# Patient Record
Sex: Male | Born: 1960 | Race: White | Hispanic: No | Marital: Married | Smoking: Current every day smoker
Health system: Southern US, Community
[De-identification: ages and names within clinical notes are randomized; demographics above are authoritative.]

## PROBLEM LIST (undated history)

## (undated) DIAGNOSIS — K279 Peptic ulcer, site unspecified, unspecified as acute or chronic, without hemorrhage or perforation: Secondary | ICD-10-CM

## (undated) DIAGNOSIS — Q2381 Bicuspid aortic valve: Secondary | ICD-10-CM

## (undated) DIAGNOSIS — G4733 Obstructive sleep apnea (adult) (pediatric): Secondary | ICD-10-CM

## (undated) DIAGNOSIS — N529 Male erectile dysfunction, unspecified: Secondary | ICD-10-CM

## (undated) DIAGNOSIS — E785 Hyperlipidemia, unspecified: Secondary | ICD-10-CM

## (undated) DIAGNOSIS — I10 Essential (primary) hypertension: Secondary | ICD-10-CM

## (undated) DIAGNOSIS — I517 Cardiomegaly: Secondary | ICD-10-CM

## (undated) DIAGNOSIS — Q231 Congenital insufficiency of aortic valve: Secondary | ICD-10-CM

## (undated) DIAGNOSIS — I351 Nonrheumatic aortic (valve) insufficiency: Secondary | ICD-10-CM

## (undated) DIAGNOSIS — N200 Calculus of kidney: Secondary | ICD-10-CM

## (undated) DIAGNOSIS — I519 Heart disease, unspecified: Secondary | ICD-10-CM

## (undated) HISTORY — DX: Calculus of kidney: N20.0

## (undated) HISTORY — DX: Obstructive sleep apnea (adult) (pediatric): G47.33

## (undated) HISTORY — DX: Peptic ulcer, site unspecified, unspecified as acute or chronic, without hemorrhage or perforation: K27.9

## (undated) HISTORY — DX: Congenital insufficiency of aortic valve: Q23.1

## (undated) HISTORY — PX: LUMBAR DISC SURGERY: SHX700

## (undated) HISTORY — DX: Hyperlipidemia, unspecified: E78.5

## (undated) HISTORY — PX: LUMBAR LAMINECTOMY: SHX95

## (undated) HISTORY — DX: Cardiomegaly: I51.7

## (undated) HISTORY — DX: Nonrheumatic aortic (valve) insufficiency: I35.1

## (undated) HISTORY — DX: Essential (primary) hypertension: I10

## (undated) HISTORY — DX: Bicuspid aortic valve: Q23.81

## (undated) HISTORY — DX: Heart disease, unspecified: I51.9

## (undated) HISTORY — DX: Male erectile dysfunction, unspecified: N52.9

---

## 1999-02-25 ENCOUNTER — Ambulatory Visit (HOSPITAL_COMMUNITY): Admission: RE | Admit: 1999-02-25 | Discharge: 1999-02-25 | Payer: Self-pay | Admitting: Cardiology

## 1999-03-05 ENCOUNTER — Encounter: Payer: Self-pay | Admitting: Thoracic Surgery (Cardiothoracic Vascular Surgery)

## 1999-03-06 ENCOUNTER — Encounter: Payer: Self-pay | Admitting: Thoracic Surgery (Cardiothoracic Vascular Surgery)

## 1999-03-06 ENCOUNTER — Ambulatory Visit (HOSPITAL_COMMUNITY): Admission: RE | Admit: 1999-03-06 | Discharge: 1999-03-06 | Payer: Self-pay | Admitting: *Deleted

## 1999-03-12 ENCOUNTER — Encounter: Payer: Self-pay | Admitting: Thoracic Surgery (Cardiothoracic Vascular Surgery)

## 1999-03-12 ENCOUNTER — Inpatient Hospital Stay (HOSPITAL_COMMUNITY)
Admission: RE | Admit: 1999-03-12 | Discharge: 1999-03-31 | Payer: Self-pay | Admitting: Thoracic Surgery (Cardiothoracic Vascular Surgery)

## 1999-03-12 HISTORY — PX: AORTIC VALVE REPLACEMENT: SHX41

## 1999-03-13 ENCOUNTER — Encounter: Payer: Self-pay | Admitting: Thoracic Surgery (Cardiothoracic Vascular Surgery)

## 1999-03-14 ENCOUNTER — Encounter: Payer: Self-pay | Admitting: Cardiothoracic Surgery

## 1999-03-16 ENCOUNTER — Encounter: Payer: Self-pay | Admitting: Cardiothoracic Surgery

## 1999-03-16 ENCOUNTER — Encounter: Payer: Self-pay | Admitting: General Surgery

## 1999-03-18 ENCOUNTER — Encounter: Payer: Self-pay | Admitting: Thoracic Surgery (Cardiothoracic Vascular Surgery)

## 1999-03-19 ENCOUNTER — Encounter: Payer: Self-pay | Admitting: Thoracic Surgery (Cardiothoracic Vascular Surgery)

## 1999-03-20 ENCOUNTER — Encounter: Payer: Self-pay | Admitting: Urology

## 1999-03-24 ENCOUNTER — Encounter: Payer: Self-pay | Admitting: Thoracic Surgery (Cardiothoracic Vascular Surgery)

## 1999-04-11 ENCOUNTER — Encounter: Admission: RE | Admit: 1999-04-11 | Discharge: 1999-07-10 | Payer: Self-pay | Admitting: Endocrinology

## 1999-06-16 ENCOUNTER — Encounter: Admission: RE | Admit: 1999-06-16 | Discharge: 1999-06-16 | Payer: Self-pay | Admitting: Endocrinology

## 1999-07-28 ENCOUNTER — Encounter: Admission: RE | Admit: 1999-07-28 | Discharge: 1999-10-26 | Payer: Self-pay | Admitting: Endocrinology

## 1999-08-14 ENCOUNTER — Encounter: Admission: RE | Admit: 1999-08-14 | Discharge: 1999-08-14 | Payer: Self-pay | Admitting: Endocrinology

## 1999-08-14 ENCOUNTER — Encounter: Payer: Self-pay | Admitting: Endocrinology

## 2000-02-20 ENCOUNTER — Encounter: Admission: RE | Admit: 2000-02-20 | Discharge: 2000-02-20 | Payer: Self-pay | Admitting: Endocrinology

## 2000-02-20 ENCOUNTER — Encounter: Payer: Self-pay | Admitting: Endocrinology

## 2000-09-16 ENCOUNTER — Encounter: Payer: Self-pay | Admitting: Endocrinology

## 2000-09-16 ENCOUNTER — Encounter: Admission: RE | Admit: 2000-09-16 | Discharge: 2000-09-16 | Payer: Self-pay | Admitting: Endocrinology

## 2001-05-05 ENCOUNTER — Encounter: Admission: RE | Admit: 2001-05-05 | Discharge: 2001-05-05 | Payer: Self-pay | Admitting: Endocrinology

## 2001-05-05 ENCOUNTER — Encounter: Payer: Self-pay | Admitting: Endocrinology

## 2001-11-09 ENCOUNTER — Emergency Department (HOSPITAL_COMMUNITY): Admission: EM | Admit: 2001-11-09 | Discharge: 2001-11-09 | Payer: Self-pay | Admitting: Emergency Medicine

## 2001-11-09 ENCOUNTER — Encounter: Payer: Self-pay | Admitting: Emergency Medicine

## 2003-07-04 ENCOUNTER — Encounter: Admission: RE | Admit: 2003-07-04 | Discharge: 2003-07-04 | Payer: Self-pay | Admitting: Internal Medicine

## 2008-04-25 ENCOUNTER — Encounter: Payer: Self-pay | Admitting: Internal Medicine

## 2008-04-25 ENCOUNTER — Ambulatory Visit (HOSPITAL_BASED_OUTPATIENT_CLINIC_OR_DEPARTMENT_OTHER): Admission: RE | Admit: 2008-04-25 | Discharge: 2008-04-25 | Payer: Self-pay | Admitting: Cardiology

## 2008-05-05 ENCOUNTER — Ambulatory Visit: Payer: Self-pay | Admitting: Internal Medicine

## 2008-07-19 ENCOUNTER — Ambulatory Visit: Payer: Self-pay | Admitting: Internal Medicine

## 2008-07-19 DIAGNOSIS — I10 Essential (primary) hypertension: Secondary | ICD-10-CM | POA: Insufficient documentation

## 2008-07-19 DIAGNOSIS — E785 Hyperlipidemia, unspecified: Secondary | ICD-10-CM

## 2008-07-19 DIAGNOSIS — K219 Gastro-esophageal reflux disease without esophagitis: Secondary | ICD-10-CM | POA: Insufficient documentation

## 2008-07-19 DIAGNOSIS — J309 Allergic rhinitis, unspecified: Secondary | ICD-10-CM | POA: Insufficient documentation

## 2008-07-19 DIAGNOSIS — G4730 Sleep apnea, unspecified: Secondary | ICD-10-CM | POA: Insufficient documentation

## 2008-07-31 DIAGNOSIS — E119 Type 2 diabetes mellitus without complications: Secondary | ICD-10-CM

## 2010-05-08 DEATH — deceased

## 2010-06-26 ENCOUNTER — Ambulatory Visit: Payer: Self-pay | Admitting: Cardiology

## 2010-07-21 ENCOUNTER — Ambulatory Visit: Payer: Self-pay | Admitting: Cardiology

## 2010-07-21 ENCOUNTER — Encounter: Payer: Self-pay | Admitting: Cardiology

## 2010-07-21 ENCOUNTER — Ambulatory Visit (HOSPITAL_COMMUNITY)
Admission: RE | Admit: 2010-07-21 | Discharge: 2010-07-21 | Payer: Self-pay | Source: Home / Self Care | Admitting: Cardiology

## 2010-07-21 ENCOUNTER — Ambulatory Visit: Payer: Self-pay

## 2010-08-20 ENCOUNTER — Ambulatory Visit: Payer: Self-pay | Admitting: Cardiology

## 2010-08-26 ENCOUNTER — Encounter: Payer: Self-pay | Admitting: Cardiology

## 2010-08-26 ENCOUNTER — Encounter (HOSPITAL_COMMUNITY): Admission: RE | Admit: 2010-08-26 | Payer: Self-pay | Source: Home / Self Care | Admitting: Cardiology

## 2010-08-26 ENCOUNTER — Ambulatory Visit: Payer: Self-pay

## 2010-09-02 ENCOUNTER — Telehealth (INDEPENDENT_AMBULATORY_CARE_PROVIDER_SITE_OTHER): Payer: Self-pay | Admitting: *Deleted

## 2010-09-03 ENCOUNTER — Encounter: Payer: Self-pay | Admitting: Cardiology

## 2010-09-03 ENCOUNTER — Encounter: Payer: Self-pay | Admitting: Internal Medicine

## 2010-09-03 ENCOUNTER — Encounter (HOSPITAL_COMMUNITY)
Admission: RE | Admit: 2010-09-03 | Discharge: 2010-10-07 | Payer: Self-pay | Source: Home / Self Care | Attending: Cardiology | Admitting: Cardiology

## 2010-10-09 NOTE — Assessment & Plan Note (Addendum)
Summary: Cardiology Nuclear Testing  Nuclear Med Background Indications for Stress Test: Evaluation for Ischemia, Post Hospital  Indications Comments: [facility] 2 weeks ago with syncope   History: Echo, Heart Catheterization, Myocardial Perfusion Study  History Comments: '98 MPS:OK per patient; '01 AVR; 07/21/10  Echo:EF=50%  Symptoms: Dizziness, DOE, Fatigue, Syncope  Symptoms Comments: Syncopal episode 2 weeks ago while coughing, after recovery from pneumonia.   Nuclear Pre-Procedure Cardiac Risk Factors: History of Smoking, Hypertension, IDDM Type 2, Lipids, Obesity Caffeine/Decaff Intake: none NPO After: 9:00 PM Lungs: Diminished, but no wheezes.  O2  Sat 97% on RA. IV 0.9% NS with Angio Cath: 20g     IV Site: R Hand IV Started by: Cathlyn Parsons, RN Chest Size (in) 42     Height (in): 69 Weight (lb): 204 BMI: 30.23 Tech Comments: Metoprolol held x 13 hours. Patient held insulin this am. BS 356 at 0940.  Nuclear Med Study 1 or 2 day study:  1 day     Stress Test Type:  Treadmill/Lexiscan Reading MD:  Olga Millers, MD     Referring MD:  Roger Shelter, MD Resting Radionuclide:  Technetium 65m Tetrofosmin     Resting Radionuclide Dose:  11 mCi  Stress Radionuclide:  Technetium 85m Tetrofosmin     Stress Radionuclide Dose:  33 mCi   Stress Protocol Exercise Time (min):  2:00 min     Max HR:  100 bpm     Predicted Max HR:  172 bpm  Max Systolic BP: 151 mm Hg     Percent Max HR:  58.14 %Rate Pressure Product:  98119  Lexiscan: 0.4 mg   Stress Test Technologist:  Rea College, CMA-N     Nuclear Technologist:  Domenic Polite, CNMT  Rest Procedure  Myocardial perfusion imaging was performed at rest 45 minutes following the intravenous administration of Technetium 82m Tetrofosmin.  Stress Procedure  The patient received IV Lexiscan 0.4 mg over 15-seconds with concurrent low level exercise and then Technetium 5m Tetrofosmin was injected at 30-seconds.  There were no  significant changes with Lexiscan.  Quantitative spect images were obtained after a 45 minute delay.  QPS Raw Data Images:  Normal; no motion artifact; normal heart/lung ratio. Stress Images:  Thinning of inferior wall Rest Images:  Thinning of inferior wall Subtraction (SDS):  No ischemia Transient Ischemic Dilatation:  .90  (Normal <1.22)  Lung/Heart Ratio:  .31  (Normal <0.45)  Quantitative Gated Spect Images QGS EDV:  110 ml QGS ESV:  54 ml QGS EF:  51 % QGS cine images:  Septal akinesis (due to previous thoracotomy) otherwise normal  Findings Normal nuclear study      Overall Impression  Exercise Capacity: Lexiscan with no exercise. ECG Impression: No significant ST segment change suggestive of ischemia. Overall Impression: Normal stress nuclear study.

## 2010-10-09 NOTE — Progress Notes (Signed)
Summary: Nuclear Pre-Procedure  Phone Note Outgoing Call Call back at [facility] Phone [phone]   Call placed by: Stanton Kidney, EMT-P,  September 02, 2010 4:45 PM Call placed to: Patient Action Taken: Phone Call Completed Summary of Call: Reviewed information on Myoview Information Sheet (see scanned document for further details).  Spoke with the patient. Stanton Kidney, EMT-P  September 02, 2010 4:45 PM     Nuclear Med Background Indications for Stress Test: Evaluation for Ischemia   History: Echo, Myocardial Perfusion Study  History Comments: '98 MPS 11/11 Echo:EF=50%  Symptoms: SOB    Nuclear Pre-Procedure Cardiac Risk Factors: History of Smoking, Hypertension, IDDM Type 2, Lipids, Obesity

## 2010-12-03 ENCOUNTER — Telehealth: Payer: Self-pay | Admitting: *Deleted

## 2010-12-03 NOTE — Telephone Encounter (Signed)
[facility] DR Deborah Chalk HAD SPOKEN TO HIM IN THE PAST ABOUT REFERRING HIM TO ANOTHER DR.  REQUESTING A CALL BACK

## 2010-12-04 ENCOUNTER — Telehealth: Payer: Self-pay | Admitting: *Deleted

## 2010-12-04 NOTE — Telephone Encounter (Signed)
Pt called, unable to have an erection with high doses of Viagra and Cialis.  Norma Fredrickson NP had recommended pt go to urologist if pt failed Viagra and Cialis.  RN set pt up to see Dr. Vonita Moss at [facility] on April 9th at 1115 am.  Pt notified.

## 2010-12-11 ENCOUNTER — Telehealth: Payer: Self-pay | Admitting: Cardiology

## 2010-12-11 NOTE — Telephone Encounter (Signed)
[facility] WAITING FOR FAX FROM KELLY. PLACED CHART IN KELLY'S BOX.

## 2010-12-23 ENCOUNTER — Telehealth: Payer: Self-pay | Admitting: Cardiology

## 2010-12-23 NOTE — Telephone Encounter (Signed)
Wants to speak with Tresa Endo regarding his recent visit to the urologist.

## 2010-12-23 NOTE — Telephone Encounter (Signed)
Wants to speak w/Kelly regarding his visit with the urologist that we referred him to.

## 2010-12-23 NOTE — Telephone Encounter (Signed)
Pt recently saw Dr. Vonita Moss and was given four options for his erectile dysfunction.  Pt has decided to go with the injections.  Pt has been unable to reach Dr. Enos Fling office regarding starting the injections and would like to know what to do.  RN called Dr. Enos Fling nurse and she will call pt later to day about starting injections.  Pt aware.

## 2010-12-27 ENCOUNTER — Emergency Department (HOSPITAL_COMMUNITY)
Admission: EM | Admit: 2010-12-27 | Discharge: 2010-12-27 | Disposition: A | Discharge status: Home / Self Care | Payer: Managed Care, Other (non HMO) | Attending: Emergency Medicine | Admitting: Emergency Medicine

## 2010-12-27 DIAGNOSIS — F329 Major depressive disorder, single episode, unspecified: Secondary | ICD-10-CM | POA: Insufficient documentation

## 2010-12-27 DIAGNOSIS — F3289 Other specified depressive episodes: Secondary | ICD-10-CM | POA: Insufficient documentation

## 2010-12-27 DIAGNOSIS — R10816 Epigastric abdominal tenderness: Secondary | ICD-10-CM | POA: Insufficient documentation

## 2010-12-27 DIAGNOSIS — T45515A Adverse effect of anticoagulants, initial encounter: Secondary | ICD-10-CM | POA: Insufficient documentation

## 2010-12-27 DIAGNOSIS — K2990 Gastroduodenitis, unspecified, without bleeding: Secondary | ICD-10-CM | POA: Insufficient documentation

## 2010-12-27 DIAGNOSIS — F411 Generalized anxiety disorder: Secondary | ICD-10-CM | POA: Insufficient documentation

## 2010-12-27 DIAGNOSIS — R791 Abnormal coagulation profile: Secondary | ICD-10-CM | POA: Insufficient documentation

## 2010-12-27 DIAGNOSIS — K2970 Gastritis, unspecified, without bleeding: Secondary | ICD-10-CM | POA: Insufficient documentation

## 2010-12-27 DIAGNOSIS — Z794 Long term (current) use of insulin: Secondary | ICD-10-CM | POA: Insufficient documentation

## 2010-12-27 DIAGNOSIS — I10 Essential (primary) hypertension: Secondary | ICD-10-CM | POA: Insufficient documentation

## 2010-12-27 DIAGNOSIS — E876 Hypokalemia: Secondary | ICD-10-CM | POA: Insufficient documentation

## 2010-12-27 DIAGNOSIS — E119 Type 2 diabetes mellitus without complications: Secondary | ICD-10-CM | POA: Insufficient documentation

## 2010-12-27 LAB — COMPREHENSIVE METABOLIC PANEL
ALT: 15 U/L (ref 0–53)
AST: 18 U/L (ref 0–37)
Albumin: 3.7 g/dL (ref 3.5–5.2)
Alkaline Phosphatase: 135 U/L — ABNORMAL HIGH (ref 39–117)
Calcium: 10 mg/dL (ref 8.4–10.5)
GFR calc Af Amer: >60 mL/min (ref >60)
Potassium: 2.9 mEq/L — ABNORMAL LOW (ref 3.5–5.1)
Sodium: 137 mEq/L (ref 135–145)
Total Protein: 7.2 g/dL (ref 6.0–8.3)

## 2010-12-27 LAB — ETHANOL: Alcohol, Ethyl (B): <5 mg/dL (ref 0–10)

## 2010-12-27 LAB — CBC
HCT: 40 % (ref 39.0–52.0)
MCH: 27.6 pg (ref 26.0–34.0)
MCHC: 34 g/dL (ref 30.0–36.0)
Platelets: 282 10*3/uL (ref 150–400)
RBC: 4.93 MIL/uL (ref 4.22–5.81)
WBC: 7.7 10*3/uL (ref 4.0–10.5)

## 2010-12-27 LAB — RAPID URINE DRUG SCREEN, HOSP PERFORMED
Amphetamines: NOT DETECTED
Benzodiazepines: NOT DETECTED
Tetrahydrocannabinol: NOT DETECTED

## 2010-12-27 LAB — DIFFERENTIAL
Basophils Absolute: 0.1 10*3/uL (ref 0.0–0.1)
Basophils Relative: 1 % (ref 0–1)
Eosinophils Absolute: 0.1 10*3/uL (ref 0.0–0.7)
Lymphs Abs: 1.6 10*3/uL (ref 0.7–4.0)
Monocytes Relative: 11 % (ref 3–12)
Neutro Abs: 5.2 10*3/uL (ref 1.7–7.7)
Neutrophils Relative %: 68 % (ref 43–77)

## 2010-12-31 ENCOUNTER — Telehealth: Payer: Self-pay | Admitting: Internal Medicine

## 2011-01-01 NOTE — Telephone Encounter (Signed)
Pt rescheduled to see Mike Gip PA on a day when Dr. Marina Goodell is the supervising physician. Appt given for pt to see Amy 01/06/11@10am . Scheduled with Malachi Bonds at [facility]. Malachi Bonds to fax records, pt is a new pt to the practice.

## 2011-01-06 ENCOUNTER — Ambulatory Visit: Payer: Managed Care, Other (non HMO) | Admitting: Physician Assistant

## 2011-01-08 ENCOUNTER — Other Ambulatory Visit: Payer: Self-pay | Admitting: Endocrinology

## 2011-01-08 DIAGNOSIS — R935 Abnormal findings on diagnostic imaging of other abdominal regions, including retroperitoneum: Secondary | ICD-10-CM

## 2011-01-11 ENCOUNTER — Other Ambulatory Visit: Payer: Managed Care, Other (non HMO)

## 2011-01-12 ENCOUNTER — Ambulatory Visit
Admission: RE | Admit: 2011-01-12 | Discharge: 2011-01-12 | Disposition: A | Discharge status: Home / Self Care | Payer: Managed Care, Other (non HMO) | Source: Ambulatory Visit | Attending: Endocrinology | Admitting: Endocrinology

## 2011-01-12 ENCOUNTER — Other Ambulatory Visit: Payer: Managed Care, Other (non HMO)

## 2011-01-12 DIAGNOSIS — R935 Abnormal findings on diagnostic imaging of other abdominal regions, including retroperitoneum: Secondary | ICD-10-CM

## 2011-01-20 NOTE — Procedures (Signed)
[name], Glenn Orr              ACCOUNT NO.:  [id number]   MEDICAL RECORD NO.:  [id number]          PATIENT TYPE:  OUT   LOCATION:  SLEEP CENTER                 FACILITY:  [facility]   PHYSICIAN:  Clinton D. Maple Hudson, MD, FCCP, FACPDATE OF BIRTH:  04/26/62   DATE OF STUDY:  04/25/2008                            NOCTURNAL POLYSOMNOGRAM   REFERRING PHYSICIAN:  Colleen Can. Deborah Chalk, M.D.   INDICATION FOR STUDY:  Hypersomnia with sleep apnea.   EPWORTH SLEEPINESS SCORE:  10/24.  BMI 31.3, weight 212 pounds, height  69 inches, neck 18 inches.   MEDICATIONS:  Home medications are charted and reviewed.   SLEEP ARCHITECTURE:  Split study protocol.  During the diagnostic phase,  total sleep time 100 minutes with sleep efficiency 47.3%.  Stage 1 was  35%, stage 2 57.5%, stage 3 7.5%, REM absent.  Sleep latency 74.5  minutes.  Awake after sleep onset 25.5 minutes.  Arousal index 36.6,  indicating increased EEG arousal.  Bed time medication charted and  reviewed.   RESPIRATORY DATA:  Split study protocol.  Apnea/hypopnea index (AHI) 12  per hour.  Twenty events were scored, including five obstructive apneas  and 15 hypopneas.  Most events were associated with supine sleep  position.  REM AHI n/a.  CPAP was titrated to 14 CWP, AHI 2.4 per hour.  He chose a medium Quattro mask with heated humidifier.   OXYGEN DATA:  Mild snoring with oxygen desaturation to a nadir of 78%.  After CPAP control, mean oxygen saturation through the study 95.1% on  room air.   CARDIAC DATA:  Normal sinus rhythm.   MOVEMENT-PARASOMNIA:  A total of 52 limb jerks were counted of which 11  were associated with arousal or awakening for a periodic limb movement  with arousal index of 6.6 per hour.  No bathroom trips.  Patient was  described as extremely restless.   IMPRESSIONS-RECOMMENDATIONS:  1. Patient was restless through the study and sleep was marked by      frequent brief awakenings with sustained wakefulness  between      midnight and 3 a.m.  It was noted that he gave history of panic      attacks and requested that his wife be allowed to stay with him,      sleeping in a recliner.  Bedtime medications included omeprazole,      lovastatin, lisinopril, warfarin, Cymbalta, Humalog and      propoxyphene.  2. Mild obstructive sleep apnea/hypopnea syndrome, AHI 12 per hour,      all hypopneas associated with supine sleep position.  Mild snoring      with oxygen desaturation to a nadir of 78%.  3. CPAP titrated to 14 CWP, AHI 2.4 per hour.  He chose a medium      Mirage Quattro full face mask with heated humidifier.      Clinton D. Maple Hudson, MD, FCCP, FACP  Diplomate, Biomedical engineer of Sleep Medicine  Electronically Signed     CDY/MEDQ  D:  05/05/2008 11:18:52  T:  05/05/2008 11:43:51  Job:  [id number]

## 2011-01-28 ENCOUNTER — Ambulatory Visit: Payer: Managed Care, Other (non HMO) | Admitting: Gastroenterology

## 2011-02-06 ENCOUNTER — Ambulatory Visit: Payer: Managed Care, Other (non HMO) | Admitting: Internal Medicine

## 2012-01-31 ENCOUNTER — Other Ambulatory Visit: Payer: Self-pay | Admitting: *Deleted

## 2012-01-31 ENCOUNTER — Encounter: Payer: Self-pay | Admitting: *Deleted

## 2012-07-21 ENCOUNTER — Encounter: Payer: Self-pay | Admitting: Cardiology

## 2013-12-05 ENCOUNTER — Ambulatory Visit (INDEPENDENT_AMBULATORY_CARE_PROVIDER_SITE_OTHER): Payer: Medicare Other | Admitting: Cardiovascular Disease

## 2013-12-05 ENCOUNTER — Encounter: Payer: Self-pay | Admitting: Cardiovascular Disease

## 2013-12-05 VITALS — BP 138/86 | HR 76 | Ht 69.0 in | Wt 213.0 lb

## 2013-12-05 DIAGNOSIS — Z954 Presence of other heart-valve replacement: Secondary | ICD-10-CM

## 2013-12-05 DIAGNOSIS — I10 Essential (primary) hypertension: Secondary | ICD-10-CM

## 2013-12-05 DIAGNOSIS — Z952 Presence of prosthetic heart valve: Secondary | ICD-10-CM

## 2013-12-05 DIAGNOSIS — I739 Peripheral vascular disease, unspecified: Secondary | ICD-10-CM

## 2013-12-05 DIAGNOSIS — E785 Hyperlipidemia, unspecified: Secondary | ICD-10-CM

## 2013-12-05 NOTE — Assessment & Plan Note (Signed)
History of bicuspid aortic valve with aortic insufficiency. He had mechanical aVR performed by Dr. Roxy Manns  approximately the year 2000. His last 2-D echo was done 2- 3 years ago. He is on Coumadin anticoagulation and is aware of SBE prophylaxis.

## 2013-12-05 NOTE — Progress Notes (Signed)
12/05/2013 Glenn Orr   1960/12/01  [id number]  Primary Physician Sheela Stack, MD Primary Cardiologist: Lorretta Harp MD Renae Gloss   HPI:  Glenn Orr is a pleasant 53 year old moderately overweight but was Caucasian male father of one 13 year old daughter referred by Dr. Reynold Bowen for cardiovascular evaluation because of claudication. He is retired from working for the state. He has a history of St. Jude aVR performed by Dr. Roxy Manns back in 2000 for bicuspid aortic valve and aortic insufficiency.his cardiac risk factor profile is remarkable for tobacco abuse currently smoking one pack per day. He has diabetes, hypertension and hyperlipidemia. He has never had a heart attack or stroke and denies chest pain. He does get dyspneic on exertion. There is a question of diabetic peripheral neuropathy with tingling in his feet and numbness also gives symptoms compatible with claudication.   Current Outpatient Prescriptions  Medication Sig Dispense Refill  . atorvastatin (LIPITOR) 40 MG tablet Take 40 mg by mouth daily.      . DULoxetine (CYMBALTA) 60 MG capsule Take 60 mg by mouth daily.      . fluticasone (FLONASE) 50 MCG/ACT nasal spray Place 1 spray into both nostrils daily as needed for allergies or rhinitis.      Marland Kitchen gemfibrozil (LOPID) 600 MG tablet Take 600 mg by mouth daily.      . insulin lispro protamine-insulin lispro (HUMALOG 50/50) (50-50) 100 UNIT/ML SUSP Inject 42 Units into the skin at bedtime.       . insulin lispro protamine-insulin lispro (HUMALOG 75/25) (75-25) 100 UNIT/ML SUSP Inject 60 Units into the skin every morning.       Marland Kitchen lisinopril (PRINIVIL,ZESTRIL) 10 MG tablet Take 10 mg by mouth daily.      . metoprolol (LOPRESSOR) 50 MG tablet Take 50 mg by mouth at bedtime.       Marland Kitchen omeprazole (PRILOSEC) 20 MG capsule Take 20 mg by mouth daily.      Marland Kitchen warfarin (COUMADIN) 5 MG tablet Take 5 mg by mouth as directed. Followed by Dr Forde Dandy      . furosemide  (LASIX) 40 MG tablet Take 40 mg by mouth daily.      Marland Kitchen lovastatin (MEVACOR) 40 MG tablet Take 40 mg by mouth 2 (two) times daily.       No current facility-administered medications for this visit.    No Known Allergies  History   Social History  . Marital Status: Married    Spouse Name: N/A    Number of Children: N/A  . Years of Education: N/A   Occupational History  . Not on file.   Social History Main Topics  . Smoking status: Current Every Day Smoker -- 0.75 packs/day    Types: Cigarettes    Last Attempt to Quit: 09/07/1998  . Smokeless tobacco: Never Used  . Alcohol Use: Not on file  . Drug Use: Not on file  . Sexual Activity: Not on file   Other Topics Concern  . Not on file   Social History Narrative  . No narrative on file     Review of Systems: General: negative for chills, fever, night sweats or weight changes.  Cardiovascular: negative for chest pain, dyspnea on exertion, edema, orthopnea, palpitations, paroxysmal nocturnal dyspnea or shortness of breath Dermatological: negative for rash Respiratory: negative for cough or wheezing Urologic: negative for hematuria Abdominal: negative for nausea, vomiting, diarrhea, bright red blood per rectum, melena, or hematemesis Neurologic: negative for visual changes, syncope,  or dizziness All other systems reviewed and are otherwise negative except as noted above.    Blood pressure 138/86, pulse 76, height 5\' 9"  (1.753 m), weight 213 lb (96.616 kg).  General appearance: alert and no distress Neck: no adenopathy, no carotid bruit, no JVD, supple, symmetrical, trachea midline and thyroid not enlarged, symmetric, no tenderness/mass/nodules Lungs: clear to auscultation bilaterally Heart: Chrisp prosthetic valve sounds Extremities: extremities normal, atraumatic, no cyanosis or edema and 2+ femoral and pedal pulses  EKG normal sinus rhythm at 76 with nonspecific ST and T wave changes  ASSESSMENT AND PLAN:    Claudication The patient has good pedal pulses. He does have symptoms compatible with diabetic peripheral neuropathy. He does complain of some symptoms that may be claudication in nature. Will check lower extremity arterial Doppler studies.  HYPERTENSION Controlled on current medications  HYPERLIPIDEMIA On statin therapy followed by his PCP  H/O aortic valve replacement History of bicuspid aortic valve with aortic insufficiency. He had mechanical aVR performed by Dr. Roxy Manns  approximately the year 2000. His last 2-D echo was done 2- 3 years ago. He is on Coumadin anticoagulation and is aware of SBE prophylaxis.      Lorretta Harp MD FACP,FACC,FAHA, [facility] 12/05/2013 5:02 PM

## 2013-12-05 NOTE — Assessment & Plan Note (Signed)
Controlled on current medications 

## 2013-12-05 NOTE — Patient Instructions (Signed)
  We will see you back in follow up in 1 year with Dr Gwenlyn Found  Dr Gwenlyn Found has ordered : 1.  Echocardiogram. Echocardiography is a painless test that uses sound waves to create images of your heart. It provides your doctor with information about the size and shape of your heart and how well your heart's chambers and valves are working. This procedure takes approximately one hour. There are no restrictions for this procedure.   2. lower extremity arterial doppler- During this test, ultrasound is used to evaluate arterial blood flow in the legs. Allow approximately one hour for this exam.

## 2013-12-05 NOTE — Assessment & Plan Note (Signed)
The patient has good pedal pulses. He does have symptoms compatible with diabetic peripheral neuropathy. He does complain of some symptoms that may be claudication in nature. Will check lower extremity arterial Doppler studies.

## 2013-12-05 NOTE — Assessment & Plan Note (Signed)
On statin therapy followed by his PCP 

## 2013-12-21 ENCOUNTER — Telehealth (HOSPITAL_COMMUNITY): Payer: Self-pay | Admitting: *Deleted

## 2013-12-26 ENCOUNTER — Ambulatory Visit (HOSPITAL_COMMUNITY)
Admission: RE | Admit: 2013-12-26 | Discharge: 2013-12-26 | Disposition: A | Discharge status: Home / Self Care | Payer: Medicare Other | Source: Ambulatory Visit | Attending: Cardiology | Admitting: Cardiology

## 2013-12-26 ENCOUNTER — Ambulatory Visit (HOSPITAL_BASED_OUTPATIENT_CLINIC_OR_DEPARTMENT_OTHER)
Admission: RE | Admit: 2013-12-26 | Discharge: 2013-12-26 | Disposition: A | Discharge status: Home / Self Care | Payer: Medicare Other | Source: Ambulatory Visit | Attending: Cardiology | Admitting: Cardiology

## 2013-12-26 DIAGNOSIS — Z952 Presence of prosthetic heart valve: Secondary | ICD-10-CM

## 2013-12-26 DIAGNOSIS — I739 Peripheral vascular disease, unspecified: Secondary | ICD-10-CM

## 2013-12-26 DIAGNOSIS — Z954 Presence of other heart-valve replacement: Secondary | ICD-10-CM | POA: Insufficient documentation

## 2013-12-26 DIAGNOSIS — I517 Cardiomegaly: Secondary | ICD-10-CM

## 2013-12-26 NOTE — Progress Notes (Signed)
2D Echo Performed 12/26/2013    Marygrace Drought, RCS

## 2013-12-26 NOTE — Progress Notes (Signed)
Lower Extremity Arterial Duplex Completed. °Brianna L Mazza,RVT °

## 2014-01-01 ENCOUNTER — Encounter: Payer: Self-pay | Admitting: *Deleted

## 2014-03-15 ENCOUNTER — Encounter: Payer: Self-pay | Admitting: Internal Medicine

## 2014-05-23 ENCOUNTER — Telehealth: Payer: Self-pay

## 2014-05-23 ENCOUNTER — Ambulatory Visit (INDEPENDENT_AMBULATORY_CARE_PROVIDER_SITE_OTHER): Payer: Medicare Other | Admitting: Internal Medicine

## 2014-05-23 ENCOUNTER — Encounter: Payer: Self-pay | Admitting: Internal Medicine

## 2014-05-23 VITALS — BP 144/82 | HR 72 | Ht 67.0 in | Wt 215.2 lb

## 2014-05-23 DIAGNOSIS — E119 Type 2 diabetes mellitus without complications: Secondary | ICD-10-CM

## 2014-05-23 DIAGNOSIS — Z1211 Encounter for screening for malignant neoplasm of colon: Secondary | ICD-10-CM

## 2014-05-23 DIAGNOSIS — Z7901 Long term (current) use of anticoagulants: Secondary | ICD-10-CM

## 2014-05-23 DIAGNOSIS — Z794 Long term (current) use of insulin: Secondary | ICD-10-CM

## 2014-05-23 DIAGNOSIS — K219 Gastro-esophageal reflux disease without esophagitis: Secondary | ICD-10-CM

## 2014-05-23 MED ORDER — MOVIPREP 100 G PO SOLR: 1.0000 | Freq: Once | ORAL | Status: DC

## 2014-05-23 NOTE — Patient Instructions (Signed)
You have been scheduled for a colonoscopy. Please follow written instructions given to you at your visit today.  Please pick up your prep kit at the pharmacy within the next 1-3 days. If you use inhalers (even only as needed), please bring them with you on the day of your procedure. Your physician has requested that you go to www.startemmi.com and enter the access code given to you at your visit today. This web site gives a general overview about your procedure. However, you should still follow specific instructions given to you by our office regarding your preparation for the procedure.   I will contact Dr. Gwenlyn Found regarding how to handle your Coumadin before the procedure and let you know.

## 2014-05-23 NOTE — Telephone Encounter (Signed)
Mechanical AVR. OK to come off coumadin for colonoscopy if absolutely necessary  JJB

## 2014-05-23 NOTE — Telephone Encounter (Signed)
  05/23/2014   RE: Glenn Orr DOB: [birth year] MRN: [id number]   Dear Gwenlyn Found,    We have scheduled the above patient for an endoscopic procedure. Our records show that he is on anticoagulation therapy.   Please advise as to how long the patient may come off his therapy of Coumadin prior to the procedure, or if he will need a Lovenox bridge, which is scheduled for 06/08/2014.  Please fax back/ or route the completed form to [place] at [phone].   Sincerely,    Phillis Haggis

## 2014-05-23 NOTE — Progress Notes (Signed)
HISTORY OF PRESENT ILLNESS:  Glenn Orr is a 53 y.o. male with multiple significant medical problems including valvular heart disease status post aortic valve replacement (mechanical valve St. Jude) on chronic Coumadin, insulin requiring diabetes mellitus, hypertension, hyperlipidemia, obesity, sleep apnea, and chronic tobacco use. He is sent today regarding screening colonoscopy. No prior history of the same. GI review of systems is remarkable for heartburn without dysphagia. On demand PPI helps. No other GI complaints. His primary care physician is Dr. Forde Dandy and his cardiologist is Dr. Gwenlyn Found.   REVIEW OF SYSTEMS:  All non-GI ROS negative except for shortness of breath, insomnia, arthritis, cough  Past Medical History  Diagnosis Date  . Diabetes mellitus   . Bicuspid aortic valve   . Aortic insufficiency   . Left ventricular dysfunction     EF ofn55-60%  . Left ventricular hypertrophy   . Erectile dysfunction   . Duodenal ulcer   . Hypertension   . Hyperlipidemia   . Kidney stones   . Peptic ulcer disease   . OSA (obstructive sleep apnea)     Past Surgical History  Procedure Laterality Date  . Aortic valve replacement  03/12/1999    #27 St. Jude mechanical valve  . Lumbar laminectomy    . Lumbar disc surgery      Social History Glenn Orr  reports that he has been smoking Cigarettes.  He has been smoking about 0.75 packs per day. He has never used smokeless tobacco. He reports that he does not drink alcohol or use illicit drugs.  family history is negative for Colon cancer, Colon polyps, Diabetes, and Kidney disease.  No Known Allergies     PHYSICAL EXAMINATION: Vital signs: BP 144/82  Pulse 72  Ht 5\' 7"  (1.702 m)  Wt 215 lb 4 oz (97.637 kg)  BMI 33.71 kg/m2  Constitutional: generally well-appearing, no acute distress Psychiatric: alert and oriented x3, cooperative Eyes: extraocular movements intact, anicteric, conjunctiva pink Mouth: oral pharynx moist,  no lesions Neck: supple no lymphadenopathy Cardiovascular: Mechanical heart sounds with heart regular rate and rhythm, no murmur Lungs: clear to auscultation bilaterally Abdomen: soft, obese, nontender, nondistended, no obvious ascites, no peritoneal signs, normal bowel sounds, no organomegaly Rectal: Deferred until colonoscopy Extremities: no lower extremity edema bilaterally Skin: no lesions on visible extremities Neuro: No focal deficits.   ASSESSMENT:  #1. Screening colonoscopy. Appropriate candidate without contraindication. High risk due to his comorbidities and the need to manage chronic medications as described #2. Insulin requiring diabetes #3. Status post aortic valve replacement on chronic Coumadin #4. Chronic GERD #5. Multiple medical problems   PLAN:  #1. Screening colonoscopy.The nature of the procedure, as well as the risks, benefits, and alternatives were carefully and thoroughly reviewed with the patient. Ample time for discussion and questions allowed. The patient understood, was satisfied, and agreed to proceed. #2. Consult with his cardiologist regarding management of chronic anticoagulation therapy. He may need Lovenox bridge #3. Adjust insulin. Recommend take 30 units of his evening insulin and hold a.m. insulin. We will schedule procedure for morning slot #4. Reflux precautions #5. Advised to take omeprazole daily for chronic GERD symptoms. As well protect upper GI mucosa in a patient on chronic Coumadin

## 2014-05-25 NOTE — Telephone Encounter (Signed)
Please see below  Thank you, Brita Romp RN [place], [place] [phone]

## 2014-05-30 NOTE — Telephone Encounter (Signed)
Told patient that per Dr. Gwenlyn Found, he could hold his coumadin for 5 days prior to his procedure.  Also told him I would leave a free movi prep voucher up front for him.  Patient acknowledged and understood

## 2014-06-01 ENCOUNTER — Telehealth: Payer: Self-pay | Admitting: Internal Medicine

## 2014-06-01 NOTE — Telephone Encounter (Signed)
Patient came by to let me know what he has been instructed to do prior to his colonoscopy.  It appeared to be a Lovenox bridge but could you please clarify these instructions so I can have them for his chart?  Thank you.

## 2014-06-05 NOTE — Telephone Encounter (Signed)
No Lovenox bridging required for colonoscopy with holding Coumadin in the setting of aVR

## 2014-06-06 NOTE — Telephone Encounter (Signed)
Called patient to let him know that the injections he described were not a Lovenox bridge, per Dr. Gwenlyn Found and he should just hold his Coumadin as directed.  Patient acknowledged and understood

## 2014-06-08 ENCOUNTER — Ambulatory Visit (AMBULATORY_SURGERY_CENTER): Payer: Medicare Other | Admitting: Internal Medicine

## 2014-06-08 ENCOUNTER — Encounter: Payer: Self-pay | Admitting: Internal Medicine

## 2014-06-08 VITALS — BP 146/79 | HR 81 | Temp 97.4°F | Resp 20 | Ht 67.0 in | Wt 215.0 lb

## 2014-06-08 DIAGNOSIS — Z1211 Encounter for screening for malignant neoplasm of colon: Secondary | ICD-10-CM

## 2014-06-08 DIAGNOSIS — D125 Benign neoplasm of sigmoid colon: Secondary | ICD-10-CM

## 2014-06-08 LAB — GLUCOSE, CAPILLARY
GLUCOSE-CAPILLARY: 258 mg/dL — AB (ref 70–99)
Glucose-Capillary: 259 mg/dL — ABNORMAL HIGH (ref 70–99)

## 2014-06-08 MED ORDER — SODIUM CHLORIDE 0.9 % IV SOLN: 500.0000 mL | INTRAVENOUS | Status: DC

## 2014-06-08 NOTE — Op Note (Signed)
[place]  [address] [place], [id number]   COLONOSCOPY PROCEDURE REPORT  PATIENT: Glenn, Orr  MR#: [id number] BIRTHDATE: Oct 22, 1960 , 16  yrs. old GENDER: male ENDOSCOPIST: Eustace Quail, MD REFERRED JF:[id number] Forde Dandy, M.D. PROCEDURE DATE:  06/08/2014 PROCEDURE:   Colonoscopy with snare polypectomy x 1 First Screening Colonoscopy - Avg.  risk and is 50 yrs.  old or older Yes.  Prior Negative Screening - Now for repeat screening. N/A  History of Adenoma - Now for follow-up colonoscopy & has been > or = to 3 yrs.  N/A  Polyps Removed Today? Yes. ASA CLASS:   Class III INDICATIONS:average risk for colorectal cancer. MEDICATIONS: Monitored anesthesia care and Propofol 280 mg IV  DESCRIPTION OF PROCEDURE:   After the risks benefits and alternatives of the procedure were thoroughly explained, informed consent was obtained.  The digital rectal exam revealed no abnormalities of the rectum.   The [id number]  endoscope was introduced through the anus and advanced to the cecum, which was identified by both the appendix and ileocecal valve. No adverse events experienced.   The quality of the prep was excellent, using MoviPrep  The instrument was then slowly withdrawn as the colon was fully examined.   COLON FINDINGS: A pedunculated polyp measuring 5 mm in size was found in the sigmoid colon.  A polypectomy was performed with a cold snare.  The resection was complete, the polyp tissue was completely retrieved and sent to histology.   The examination was otherwise normal.  Retroflexed views revealed internal hemorrhoids. The time to cecum=3 minutes 0 seconds.  Withdrawal time=10 minutes 09 seconds.  The scope was withdrawn and the procedure completed. COMPLICATIONS: There were no immediate complications.  ENDOSCOPIC IMPRESSION: 1.   Pedunculated polyp was found in the sigmoid colon; polypectomy was performed with a cold snare 2.   The examination  was otherwise normal  RECOMMENDATIONS: 1.  Repeat colonoscopy in 5 years if polyp adenomatous; otherwise 10 years 2.  Resume Coumadin (warfarin) and Lovenox today per your coumadin clinic and have your PT/INR checked within 1 week.  eSigned:  Eustace Quail, MD 06/08/2014 9:54 AM   cc: Reynold Bowen, MD and The Patient

## 2014-06-08 NOTE — Progress Notes (Signed)
A/ox3 pleased with MAC, report to Jane RN 

## 2014-06-08 NOTE — Patient Instructions (Signed)
YOU HAD AN ENDOSCOPIC PROCEDURE TODAY AT THE [facility]: Refer to the procedure report that was given to you for any specific questions about what was found during the examination.  If the procedure report does not answer your questions, please call your gastroenterologist to clarify.  If you requested that your care partner not be given the details of your procedure findings, then the procedure report has been included in a sealed envelope for you to review at your convenience later.  YOU SHOULD EXPECT: Some feelings of bloating in the abdomen. Passage of more gas than usual.  Walking can help get rid of the air that was put into your GI tract during the procedure and reduce the bloating. If you had a lower endoscopy (such as a colonoscopy or flexible sigmoidoscopy) you may notice spotting of blood in your stool or on the toilet paper. If you underwent a bowel prep for your procedure, then you may not have a normal bowel movement for a few days.  DIET: Your first meal following the procedure should be a light meal and then it is ok to progress to your normal diet.  A half-sandwich or bowl of soup is an example of a good first meal.  Heavy or fried foods are harder to digest and may make you feel nauseous or bloated.  Likewise meals heavy in dairy and vegetables can cause extra gas to form and this can also increase the bloating.  Drink plenty of fluids but you should avoid alcoholic beverages for 24 hours.  ACTIVITY: Your care partner should take you home directly after the procedure.  You should plan to take it easy, moving slowly for the rest of the day.  You can resume normal activity the day after the procedure however you should NOT DRIVE or use heavy machinery for 24 hours (because of the sedation medicines used during the test).    SYMPTOMS TO REPORT IMMEDIATELY: A gastroenterologist can be reached at any hour.  During normal business hours, 8:30 AM to 5:00 PM Monday through Friday,  call [phone].  After hours and on weekends, please call the GI answering service at [phone] who will take a message and have the physician on call contact you.   Following lower endoscopy (colonoscopy or flexible sigmoidoscopy):  Excessive amounts of blood in the stool  Significant tenderness or worsening of abdominal pains  Swelling of the abdomen that is new, acute  Fever of 100F or higher    FOLLOW UP: If any biopsies were taken you will be contacted by phone or by letter within the next 1-3 weeks.  Call your gastroenterologist if you have not heard about the biopsies in 3 weeks.  Our staff will call the home number listed on your records the next business day following your procedure to check on you and address any questions or concerns that you may have at that time regarding the information given to you following your procedure. This is a courtesy call and so if there is no answer at the home number and we have not heard from you through the emergency physician on call, we will assume that you have returned to your regular daily activities without incident.  SIGNATURES/CONFIDENTIALITY: You and/or your care partner have signed paperwork which will be entered into your electronic medical record.  These signatures attest to the fact that that the information above on your After Visit Summary has been reviewed and is understood.  Full responsibility of the confidentiality   of this discharge information lies with you and/or your care-partner.  Polyp information given.  Take Coumadin(warfarin) and Lovenox today per your coumadin clinic and have PT/INR checked within one weekl

## 2014-06-08 NOTE — Progress Notes (Signed)
Called to room to assist during endoscopic procedure.  Patient ID and intended procedure confirmed with present staff. Received instructions for my participation in the procedure from the performing physician.  

## 2014-06-11 ENCOUNTER — Telehealth: Payer: Self-pay | Admitting: *Deleted

## 2014-06-11 NOTE — Telephone Encounter (Signed)
  Follow up Call-  Call back number 06/08/2014  Post procedure Call Back phone  # [phone]  Permission to leave phone message Yes     Patient questions:  Do you have a fever, pain , or abdominal swelling? No. Pain Score  0 *  Have you tolerated food without any problems? Yes.    Have you been able to return to your normal activities? Yes.    Do you have any questions about your discharge instructions: Diet   No. Medications  No. Follow up visit  No.  Do you have questions or concerns about your Care? No.  Actions: * If pain score is 4 or above: No action needed, pain <4.

## 2014-06-13 ENCOUNTER — Encounter: Payer: Self-pay | Admitting: Internal Medicine

## 2014-10-03 ENCOUNTER — Encounter: Payer: Self-pay | Admitting: Cardiology

## 2014-12-19 ENCOUNTER — Telehealth (HOSPITAL_COMMUNITY): Payer: Self-pay | Admitting: *Deleted

## 2015-01-03 ENCOUNTER — Other Ambulatory Visit: Payer: Self-pay | Admitting: Endocrinology

## 2015-01-03 DIAGNOSIS — M549 Dorsalgia, unspecified: Secondary | ICD-10-CM

## 2015-01-08 ENCOUNTER — Ambulatory Visit
Admission: RE | Admit: 2015-01-08 | Discharge: 2015-01-08 | Disposition: A | Discharge status: Home / Self Care | Payer: Medicare HMO | Source: Ambulatory Visit | Attending: Endocrinology | Admitting: Endocrinology

## 2015-01-08 DIAGNOSIS — M549 Dorsalgia, unspecified: Secondary | ICD-10-CM

## 2015-01-08 MED ORDER — IOHEXOL 300 MG/ML  SOLN
125.0000 mL | Freq: Once | INTRAMUSCULAR | Status: AC | PRN
  Administered 2015-01-08: 125 mL via INTRAVENOUS

## 2016-04-28 ENCOUNTER — Other Ambulatory Visit: Payer: Self-pay | Admitting: Endocrinology

## 2016-04-28 DIAGNOSIS — R911 Solitary pulmonary nodule: Secondary | ICD-10-CM

## 2016-05-04 ENCOUNTER — Telehealth: Payer: Self-pay | Admitting: Cardiovascular Disease

## 2016-05-04 NOTE — Telephone Encounter (Signed)
Received records from [facility] for appointment on 06/09/16 with Dr Gwenlyn Found.  Records given to [facility] (medical records) for Dr Kennon Holter schedule on 10/3/117. lp

## 2016-05-08 ENCOUNTER — Ambulatory Visit
Admission: RE | Admit: 2016-05-08 | Discharge: 2016-05-08 | Disposition: A | Discharge status: Home / Self Care | Payer: Medicare Other | Source: Ambulatory Visit | Attending: Endocrinology | Admitting: Endocrinology

## 2016-05-08 DIAGNOSIS — R911 Solitary pulmonary nodule: Secondary | ICD-10-CM

## 2016-05-08 MED ORDER — IOPAMIDOL (ISOVUE-300) INJECTION 61%
75.0000 mL | Freq: Once | INTRAVENOUS | Status: AC | PRN
  Administered 2016-05-08: 75 mL via INTRAVENOUS

## 2016-06-09 ENCOUNTER — Encounter: Payer: Self-pay | Admitting: Cardiovascular Disease

## 2016-06-09 ENCOUNTER — Ambulatory Visit (INDEPENDENT_AMBULATORY_CARE_PROVIDER_SITE_OTHER): Payer: Medicare Other | Admitting: Cardiovascular Disease

## 2016-06-09 VITALS — BP 131/79 | HR 66 | Ht 69.0 in | Wt 276.6 lb

## 2016-06-09 DIAGNOSIS — Z952 Presence of prosthetic heart valve: Secondary | ICD-10-CM | POA: Diagnosis not present

## 2016-06-09 DIAGNOSIS — I739 Peripheral vascular disease, unspecified: Secondary | ICD-10-CM | POA: Diagnosis not present

## 2016-06-09 DIAGNOSIS — R0989 Other specified symptoms and signs involving the circulatory and respiratory systems: Secondary | ICD-10-CM

## 2016-06-09 NOTE — Assessment & Plan Note (Signed)
History of hypertension blood pressure pressures 131/79. He is on lisinopril and metoprolol. Continue current meds at current dosing

## 2016-06-09 NOTE — Assessment & Plan Note (Signed)
History of claudication with Dopplers performed 12/26/13 revealing normal ABIs bilaterally with a moderately elevated Doppler velocity in the left common iliac artery. We will repeat lower extremity arterial Doppler studies.

## 2016-06-09 NOTE — Assessment & Plan Note (Signed)
History of bicuspid aortic stenosis status post St. Jude AVR by Dr. Roxy Manns in 2000. He is on Coumadin anticoagulation. His last 2-D echocardiogram performed 12/26/13 revealed normal LV function with a well-functioning aortic mechanical prosthesis. I'm going to repeat a 2-D echocardiogram. The patient does complain of dyspnea on exertion.

## 2016-06-09 NOTE — Progress Notes (Signed)
06/09/2016 Glenn Orr   [birth year]  [id number]  Primary Physician Sheela Stack, MD Primary Cardiologist: Lorretta Harp MD Renae Gloss  HPI:  Mr. Ruffolo is a pleasant 55 year old moderately overweight but was Caucasian male father of one 70 year old daughter referred by Dr. Reynold Bowen for cardiovascular evaluation because of claudication. I last saw him in the office 12/05/13. He is retired from working for the state. He has a history of St. Jude aVR performed by Dr. Roxy Manns back in 2000 for bicuspid aortic valve and aortic insufficiency.his cardiac risk factor profile is remarkable for tobacco abuse currently smoking one pack per day. He has diabetes, hypertension and hyperlipidemia. He has never had a heart attack or stroke and denies chest pain. He does get dyspneic on exertion. There is a question of diabetic peripheral neuropathy with tingling in his feet and numbness also gives symptoms compatible with claudication. He says that he gets lightheaded when he coughs. He has had progressive dyspnea since I saw him last.    Current Outpatient Prescriptions  Medication Sig Dispense Refill  . atorvastatin (LIPITOR) 40 MG tablet Take 40 mg by mouth daily.    . DULoxetine (CYMBALTA) 60 MG capsule Take 60 mg by mouth daily.    Marland Kitchen escitalopram (LEXAPRO) 10 MG tablet Take 10 mg by mouth daily.  5  . fenofibrate 160 MG tablet Take 160 mg by mouth daily.  3  . insulin lispro protamine-insulin lispro (HUMALOG 75/25) (75-25) 100 UNIT/ML SUSP Inject 60 Units into the skin every morning.     Marland Kitchen lisinopril (PRINIVIL,ZESTRIL) 10 MG tablet Take 10 mg by mouth daily.    Marland Kitchen lovastatin (MEVACOR) 40 MG tablet Take 40 mg by mouth 2 (two) times daily.    . metoprolol (LOPRESSOR) 50 MG tablet Take 50 mg by mouth at bedtime.     Marland Kitchen warfarin (COUMADIN) 5 MG tablet Take 5 mg by mouth as directed. Followed by Dr Forde Dandy     No current facility-administered medications for this visit.     No  Known Allergies  Social History   Social History  . Marital status: Married    Spouse name: N/A  . Number of children: 1  . Years of education: N/A   Occupational History  . Retired Nature conservation officer   Social History Main Topics  . Smoking status: Current Every Day Smoker    Packs/day: 0.75    Types: Cigarettes    Last attempt to quit: 09/07/1998  . Smokeless tobacco: Never Used  . Alcohol use No  . Drug use: No  . Sexual activity: Not on file   Other Topics Concern  . Not on file   Social History Narrative  . No narrative on file     Review of Systems: General: negative for chills, fever, night sweats or weight changes.  Cardiovascular: negative for chest pain, dyspnea on exertion, edema, orthopnea, palpitations, paroxysmal nocturnal dyspnea or shortness of breath Dermatological: negative for rash Respiratory: negative for cough or wheezing Urologic: negative for hematuria Abdominal: negative for nausea, vomiting, diarrhea, bright red blood per rectum, melena, or hematemesis Neurologic: negative for visual changes, syncope, or dizziness All other systems reviewed and are otherwise negative except as noted above.    Blood pressure 131/79, pulse 66, height 5\' 9"  (1.753 m), weight 276 lb 9.6 oz (125.5 kg), SpO2 98 %.  General appearance: alert and no distress Neck: no adenopathy, no JVD, supple, symmetrical, trachea midline, thyroid not enlarged, symmetric, no tenderness/mass/nodules  and Right carotid bruit Lungs: clear to auscultation bilaterally Heart: Crisp prosthetic valve sounds Extremities: extremities normal, atraumatic, no cyanosis or edema  EKG normal sinus rhythm at 66 with nonspecific ST-T wave changes. It was T-wave inversion in the inferior leads. Persantine reviewed this EKG  ASSESSMENT AND PLAN:   HYPERLIPIDEMIA History of hyper-lipidemia on statin therapy with lipid profile performed 04/26/16 revealed total cholesterol 120, LDL 68 and HDL of  40.  HYPERTENSION History of hypertension blood pressure pressures 131/79. He is on lisinopril and metoprolol. Continue current meds at current dosing  H/O aortic valve replacement History of bicuspid aortic stenosis status post St. Jude AVR by Dr. Roxy Manns in 2000. He is on Coumadin anticoagulation. His last 2-D echocardiogram performed 12/26/13 revealed normal LV function with a well-functioning aortic mechanical prosthesis. I'm going to repeat a 2-D echocardiogram. The patient does complain of dyspnea on exertion.  Claudication History of claudication with Dopplers performed 12/26/13 revealing normal ABIs bilaterally with a moderately elevated Doppler velocity in the left common iliac artery. We will repeat lower extremity arterial Doppler studies.      Lorretta Harp MD FACP,FACC,FAHA, [facility] 06/09/2016 9:55 AM

## 2016-06-09 NOTE — Assessment & Plan Note (Signed)
History of hyper-lipidemia on statin therapy with lipid profile performed 04/26/16 revealed total cholesterol 120, LDL 68 and HDL of 40.

## 2016-06-09 NOTE — Patient Instructions (Signed)
Medication Instructions:  NO CHANGES.   Testing/Procedures: Your physician has requested that you have an echocardiogram. Echocardiography is a painless test that uses sound waves to create images of your heart. It provides your doctor with information about the size and shape of your heart and how well your heart's chambers and valves are working. This procedure takes approximately one hour. There are no restrictions for this procedure.  Your physician has requested that you have a carotid duplex. This test is an ultrasound of the carotid arteries in your neck. It looks at blood flow through these arteries that supply the brain with blood. Allow one hour for this exam. There are no restrictions or special instructions.  Your physician has requested that you have a lower extremity arterial doppler- During this test, ultrasound is used to evaluate arterial blood flow in the legs. Allow approximately one hour for this exam.     Follow-Up: Your physician recommends that you schedule a follow-up appointment in: 3-4 [place].   Any Other Special Instructions Will Be Listed Below (If Applicable).  Echocardiogram An echocardiogram, or echocardiography, uses sound waves (ultrasound) to produce an image of your heart. The echocardiogram is simple, painless, obtained within a short period of time, and offers valuable information to your health care provider. The images from an echocardiogram can provide information such as:  Evidence of coronary artery disease (CAD).  Heart size.  Heart muscle function.  Heart valve function.  Aneurysm detection.  Evidence of a past heart attack.  Fluid buildup around the heart.  Heart muscle thickening.  Assess heart valve function. LET [facility] CARE PROVIDER KNOW ABOUT:  Any allergies you have.  All medicines you are taking, including vitamins, herbs, eye drops, creams, and over-the-counter medicines.  Previous  problems you or members of your family have had with the use of anesthetics.  Any blood disorders you have.  Previous surgeries you have had.  Medical conditions you have.  Possibility of pregnancy, if this applies. BEFORE THE PROCEDURE  No special preparation is needed. Eat and drink normally.  PROCEDURE   In order to produce an image of your heart, gel will be applied to your chest and a wand-like tool (transducer) will be moved over your chest. The gel will help transmit the sound waves from the transducer. The sound waves will harmlessly bounce off your heart to allow the heart images to be captured in real-time motion. These images will then be recorded.  You may need an IV to receive a medicine that improves the quality of the pictures. AFTER THE PROCEDURE You may return to your normal schedule including diet, activities, and medicines, unless your health care provider tells you otherwise.   This information is not intended to replace advice given to you by your health care provider. Make sure you discuss any questions you have with your health care provider.   Document Released: 08/21/2000 Document Revised: 09/14/2014 Document Reviewed: 05/01/2013 Elsevier Interactive Patient Education [employer].    If you need a refill on your cardiac medications before your next appointment, please call your pharmacy.

## 2016-06-19 ENCOUNTER — Other Ambulatory Visit: Payer: Self-pay | Admitting: Cardiovascular Disease

## 2016-06-19 DIAGNOSIS — I739 Peripheral vascular disease, unspecified: Secondary | ICD-10-CM

## 2016-06-23 ENCOUNTER — Ambulatory Visit (HOSPITAL_COMMUNITY): Payer: Medicare Other | Attending: Cardiology

## 2016-06-23 ENCOUNTER — Other Ambulatory Visit: Payer: Self-pay

## 2016-06-23 DIAGNOSIS — Z720 Tobacco use: Secondary | ICD-10-CM | POA: Diagnosis not present

## 2016-06-23 DIAGNOSIS — E785 Hyperlipidemia, unspecified: Secondary | ICD-10-CM | POA: Diagnosis not present

## 2016-06-23 DIAGNOSIS — I071 Rheumatic tricuspid insufficiency: Secondary | ICD-10-CM | POA: Diagnosis not present

## 2016-06-23 DIAGNOSIS — R0989 Other specified symptoms and signs involving the circulatory and respiratory systems: Secondary | ICD-10-CM | POA: Diagnosis not present

## 2016-06-23 DIAGNOSIS — I739 Peripheral vascular disease, unspecified: Secondary | ICD-10-CM

## 2016-06-23 DIAGNOSIS — I119 Hypertensive heart disease without heart failure: Secondary | ICD-10-CM | POA: Insufficient documentation

## 2016-06-23 DIAGNOSIS — I340 Nonrheumatic mitral (valve) insufficiency: Secondary | ICD-10-CM | POA: Insufficient documentation

## 2016-06-23 DIAGNOSIS — Z952 Presence of prosthetic heart valve: Secondary | ICD-10-CM | POA: Diagnosis not present

## 2016-06-23 DIAGNOSIS — E1151 Type 2 diabetes mellitus with diabetic peripheral angiopathy without gangrene: Secondary | ICD-10-CM | POA: Diagnosis not present

## 2016-06-23 DIAGNOSIS — I359 Nonrheumatic aortic valve disorder, unspecified: Secondary | ICD-10-CM | POA: Insufficient documentation

## 2016-06-30 ENCOUNTER — Ambulatory Visit (HOSPITAL_COMMUNITY)
Admission: RE | Admit: 2016-06-30 | Discharge: 2016-06-30 | Disposition: A | Discharge status: Home / Self Care | Payer: Medicare Other | Source: Ambulatory Visit | Attending: Cardiovascular Disease | Admitting: Cardiovascular Disease

## 2016-06-30 DIAGNOSIS — E1151 Type 2 diabetes mellitus with diabetic peripheral angiopathy without gangrene: Secondary | ICD-10-CM | POA: Diagnosis not present

## 2016-06-30 DIAGNOSIS — Z952 Presence of prosthetic heart valve: Secondary | ICD-10-CM | POA: Diagnosis not present

## 2016-06-30 DIAGNOSIS — I739 Peripheral vascular disease, unspecified: Secondary | ICD-10-CM

## 2016-06-30 DIAGNOSIS — I10 Essential (primary) hypertension: Secondary | ICD-10-CM | POA: Insufficient documentation

## 2016-06-30 DIAGNOSIS — E785 Hyperlipidemia, unspecified: Secondary | ICD-10-CM | POA: Diagnosis not present

## 2016-06-30 DIAGNOSIS — R0989 Other specified symptoms and signs involving the circulatory and respiratory systems: Secondary | ICD-10-CM | POA: Diagnosis present

## 2016-07-03 ENCOUNTER — Telehealth: Payer: Self-pay | Admitting: *Deleted

## 2016-07-03 NOTE — Telephone Encounter (Signed)
Results called to pt. Pt verbalized understanding. Patient asked did he still have to come to his appt on 07/22/16. I advised to keep appt to go over the results with him in case there was anything else Dr Gwenlyn Found needed to discuss with him. He stated he would rather not come if he didn't need to and just come back next year.  Will check with Dr Gwenlyn Found.

## 2016-07-03 NOTE — Telephone Encounter (Signed)
-----   Message from Lorretta Harp, MD sent at 07/01/2016  9:13 AM EDT ----- Essentially normal study. Repeat when clinically indicated.

## 2016-07-03 NOTE — Telephone Encounter (Signed)
All studies are nl. Can simply follow up with me in 12 months  JJB

## 2016-07-03 NOTE — Telephone Encounter (Signed)
Called patient, he voiced understanding of recommendations and thanks. Will follow up in 1 year as indicated. He is aware to call sooner if new concerns.

## 2016-07-21 ENCOUNTER — Ambulatory Visit: Payer: Medicare Other | Admitting: Cardiovascular Disease

## 2018-01-18 ENCOUNTER — Ambulatory Visit: Payer: Medicare Other | Admitting: Cardiovascular Disease

## 2018-09-27 ENCOUNTER — Ambulatory Visit
Admission: RE | Admit: 2018-09-27 | Discharge: 2018-09-27 | Disposition: A | Discharge status: Home / Self Care | Payer: Medicare Other | Source: Ambulatory Visit | Attending: Endocrinology | Admitting: Endocrinology

## 2018-09-27 ENCOUNTER — Other Ambulatory Visit: Payer: Self-pay | Admitting: Endocrinology

## 2018-09-27 DIAGNOSIS — R911 Solitary pulmonary nodule: Secondary | ICD-10-CM

## 2018-09-30 ENCOUNTER — Other Ambulatory Visit: Payer: Self-pay | Admitting: Endocrinology

## 2018-09-30 DIAGNOSIS — R911 Solitary pulmonary nodule: Secondary | ICD-10-CM

## 2018-10-04 ENCOUNTER — Ambulatory Visit
Admission: RE | Admit: 2018-10-04 | Discharge: 2018-10-04 | Disposition: A | Discharge status: Home / Self Care | Payer: Medicare Other | Source: Ambulatory Visit | Attending: Endocrinology | Admitting: Endocrinology

## 2018-10-04 DIAGNOSIS — R911 Solitary pulmonary nodule: Secondary | ICD-10-CM

## 2018-11-28 ENCOUNTER — Other Ambulatory Visit: Payer: Self-pay

## 2018-11-28 ENCOUNTER — Encounter: Payer: Self-pay | Admitting: Pulmonary Disease

## 2018-11-28 ENCOUNTER — Ambulatory Visit (INDEPENDENT_AMBULATORY_CARE_PROVIDER_SITE_OTHER): Payer: Medicare Other | Admitting: Pulmonary Disease

## 2018-11-28 VITALS — BP 110/80 | HR 75 | Ht 69.0 in | Wt 225.0 lb

## 2018-11-28 DIAGNOSIS — J418 Mixed simple and mucopurulent chronic bronchitis: Secondary | ICD-10-CM | POA: Diagnosis not present

## 2018-11-28 NOTE — Progress Notes (Signed)
Subjective:   PATIENT ID: Glenn Orr GENDER: male DOB: June 25, 1961, MRN: [id number]   HPI  Chief Complaint  Patient presents with  . Consult    Pulmonary nodules ,coughing ,sob    Reason for Visit: New consult for abnormal CT  58 year old active smoker with hx bicuspid aortic stenosis s/p AVR on coumadin, HTN, DM, HLD, calcified lung nodules who presents for new consult for pulmonary nodules.  No records available for me to review from his PCP. In CareEverywhere urgent care visit on 08/23/18 reviewed. Patient was evaluated for symptomatic hypoglycemia. Recheck glucose 361 on that visit.  He reports that he has a known history of lung nodules that were followed for many years however he recently was ill this winter and was told that he had new lung spots that were seen but likely infectious or inflammatory. He denies unintentional weight loss, unexplained fevers or chills or night sweats.  He has chronic productive cough with occasional sputum. He reports sometimes laughing so hard he will pass out. He has dyspnea on exertion during activity including when he feeds his horses or putting out hay. Occasional wheezing with exertion. Uses albuterol one to times a week at most. Still active smoker and reports that he enjoys smoking and does not intend to quit.  Social History: Smoke 1 ppd x 27 years. Active smoker. Started when he was 63. At one point in time stopped smoking for 12 years but restarted.  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  Past Medical History:  Diagnosis Date  . Aortic insufficiency   . Bicuspid aortic valve   . Diabetes mellitus   . Duodenal ulcer   . Erectile dysfunction    . Hyperlipidemia   . Hypertension   . Kidney stones   . Left ventricular dysfunction    EF ofn55-60%  . Left ventricular hypertrophy   . OSA (obstructive sleep apnea)   . Peptic ulcer disease      Family History  Problem Relation Age of Onset  . Hyperlipidemia Father   . Hypertension Mother   . Hyperlipidemia Mother   . Colon cancer Neg Hx   . Colon polyps Neg Hx   . Diabetes Neg Hx   . Kidney disease Neg Hx      Social History   Occupational History  . Occupation: Retired    Fish farm manager: [employer]  Tobacco Use  . Smoking status: Current Every Day Smoker    Packs/day: 4.00    Types: Cigarettes    Start date: 09/07/1976  . Smokeless tobacco: Never Used  Substance and Sexual Activity  . Alcohol use: No  . Drug use: No  . Sexual activity: Not on file    No Known Allergies   Outpatient Medications Prior to Visit  Medication Sig Dispense Refill  . atorvastatin (LIPITOR) 40 MG tablet Take 40 mg by mouth daily.    . DULoxetine (CYMBALTA) 60 MG capsule Take 60 mg by mouth daily.    Marland Kitchen escitalopram (LEXAPRO) 10 MG tablet Take 10 mg by mouth daily.  5  . fenofibrate 160 MG tablet Take 160 mg by mouth daily.  3  . insulin lispro protamine-insulin lispro (HUMALOG 75/25) (75-25) 100 UNIT/ML SUSP Inject 60 Units into the skin every  morning.     Marland Kitchen lisinopril (PRINIVIL,ZESTRIL) 10 MG tablet Take 10 mg by mouth daily.    Marland Kitchen lovastatin (MEVACOR) 40 MG tablet Take 40 mg by mouth 2 (two) times daily.    . metoprolol (LOPRESSOR) 50 MG tablet Take 50 mg by mouth at bedtime.     Marland Kitchen warfarin (COUMADIN) 5 MG tablet Take 5 mg by mouth as directed. Followed by Dr Forde Dandy     No facility-administered medications prior to visit.     Review of Systems  Constitutional: Negative for chills, diaphoresis, fever, malaise/fatigue and weight loss.  HENT: Positive for congestion. Negative for ear pain and sore throat.   Respiratory: Positive for cough, sputum production, shortness of breath and wheezing.  Negative for hemoptysis.   Cardiovascular: Negative for chest pain, palpitations and leg swelling.  Gastrointestinal: Negative for abdominal pain, heartburn and nausea.  Genitourinary: Negative for frequency.  Musculoskeletal: Negative for joint pain and myalgias.  Skin: Negative for itching and rash.  Neurological: Negative for dizziness, weakness and headaches.  Endo/Heme/Allergies: Does not bruise/bleed easily.  Psychiatric/Behavioral: Negative for depression. The patient is not nervous/anxious.      Objective:   Vitals:   11/28/18 1540  BP: 110/80  Pulse: 75  SpO2: 98%  Weight: 225 lb (102.1 kg)  Height: 5\' 9"  (1.753 m)   SpO2: 98 % O2 Device: None (Room air)  Physical Exam: General: Well-appearing, no acute distress HENT: , AT, OP clear, MMM Eyes: EOMI, no scleral icterus Respiratory: Clear to auscultation bilaterally.  No crackles, wheezing or rales Cardiovascular: RRR, -M/R/G, no JVD GI: BS+, soft, nontender Extremities:-Edema,-tenderness Neuro: AAO x4, CNII-XII grossly intact Skin: Intact, no rashes or bruising Psych: Normal mood, normal affect  Data Reviewed:  Imaging: CT Chest 05/08/16- 6x 12 mm LUL calcified granuloma and   PFT: None on file  Labs: None on file  TTE: 06/23/16 EF 55-60%. No WMA. Aortic mechanical prosthesis present  Imaging, labs and tests noted above have been reviewed independently by me.    Assessment & Plan:   Discussion: 58 year old male active smoker who presents for abnormal CT scan with recent pneumonia and chronic dyspnea on exertion. Counseled on benefits of smoking cessation. Assessed patient's readiness to quit - not ready. Discussed trial of bronchodilator for symptom relief for suspected COPD. Will formally evaluate with PFTs at next visit.  Chronic Bronchitis --START Spiriva Respimat 2.5 mcg 1 puff daily --CONTINUE Albuterol every 4 hours as needed shortness of breath and wheezing  --Order Pulmonary Function Tests  prior to next visit --Follow-up in 4 months  Pulmonary Nodules Reviewed imaging - calcified RUL lung nodule with no concerning features, resolving right upper lobe pulmonary nodules. --Nearly resolved. Asymptomatic. No further work-up needed  Health Maintenance Influenza 07/27/18 CT Lung Screen - DNQ  Orders Placed This Encounter  Procedures  . Pulmonary function test    Standing Status:   Future    Standing Expiration Date:   11/28/2019    Order Specific Question:   Where should this test be performed?    Answer:   [facility]    Order Specific Question:   Full PFT: includes the following: basic spirometry, spirometry pre & post bronchodilator, diffusion capacity (DLCO), lung volumes    Answer:   Full PFT  No orders of the defined types were placed in this encounter.   No follow-ups on file.  [place], MD [facility] 11/28/2018 4:50 PM  Office Number [phone]

## 2018-11-28 NOTE — Patient Instructions (Addendum)
Chronic Bronchitis --START Spiriva Respimat 2.5 mcg 1 puff daily --CONTINUE Albuterol every 4 hours as needed shortness of breath and wheezing  --Order Pulmonary Function Tests prior to next visit --Follow-up in 4 months  Pulmonary Nodules --Nearly resolved. Asymptomatic. No further work-up needed

## 2018-11-30 DIAGNOSIS — J418 Mixed simple and mucopurulent chronic bronchitis: Secondary | ICD-10-CM | POA: Insufficient documentation

## 2019-07-05 IMAGING — CT CT CHEST W/O CM
1 series · 15 of 34 positions shown, 19 images · non-contrast
Comparison: 09/27/2018, 05/08/2016

CLINICAL DATA: Lung nodule follow up, smoking history recent upper
respiratory infection

EXAM:
CT CHEST WITHOUT CONTRAST
TECHNIQUE: Multidetector CT imaging of the chest was performed following the
standard protocol without IV contrast.

[Series 2: chest w/(date) · axial · 0.77mm/px · z∈[-280,-30]mm · 15 of 147 slices shown, 19 images]
[im 11/147  mediastinal]
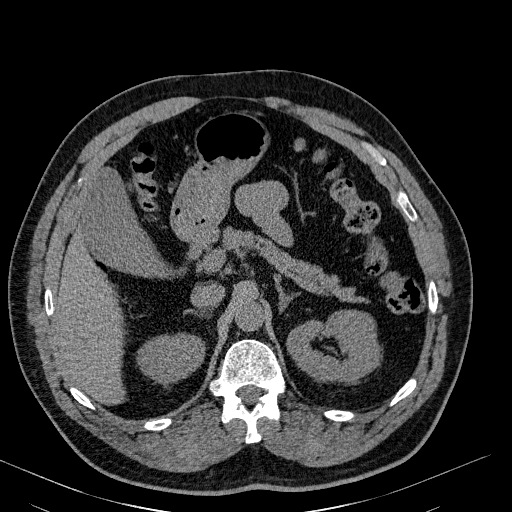
[im 11/147  lung]
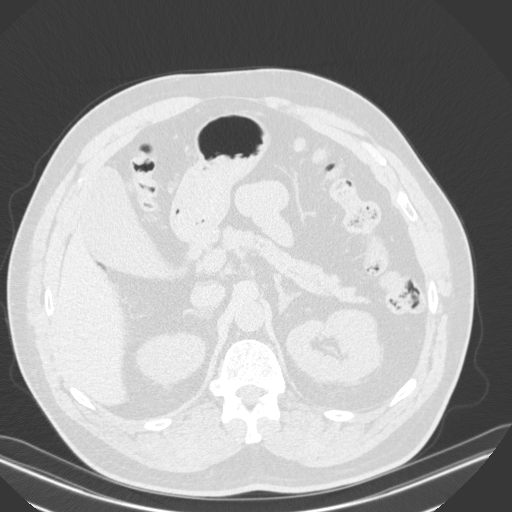
[im 22/147  lung]
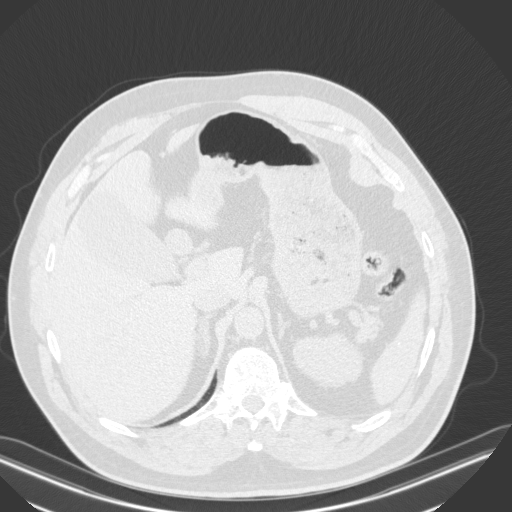
[im 30/147  lung]
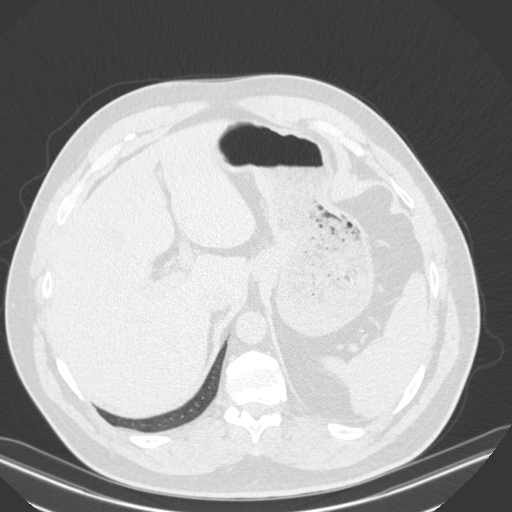
[im 38/147  lung]
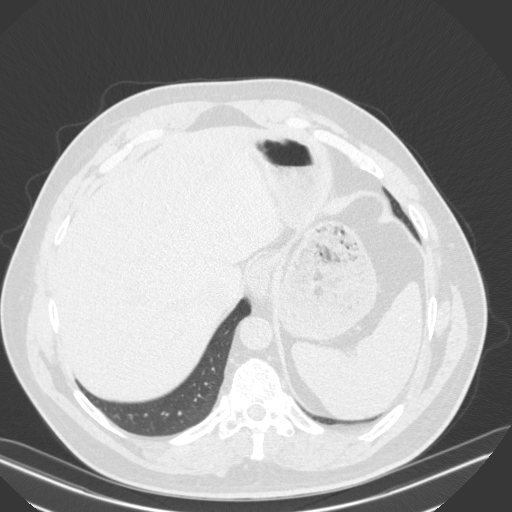
[im 49/147  mediastinal]
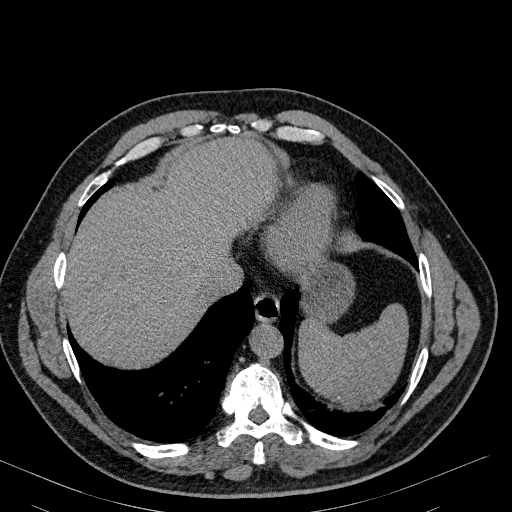
[im 49/147  lung]
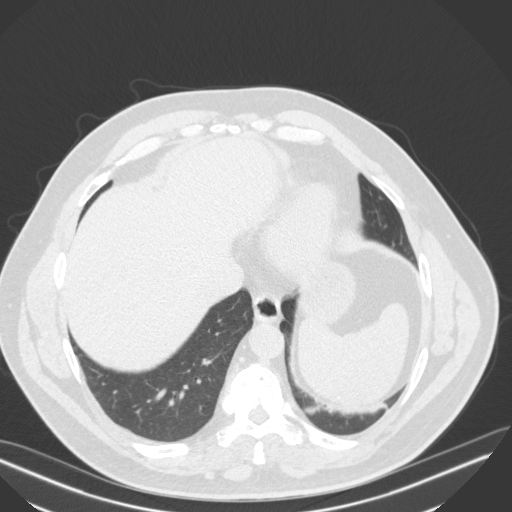
[im 59/147  lung]
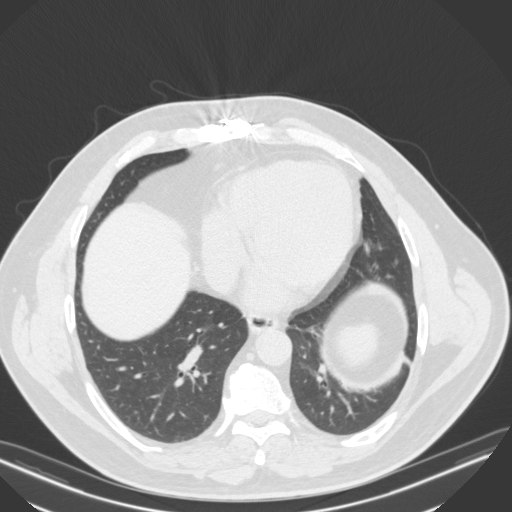
[im 65/147  lung]
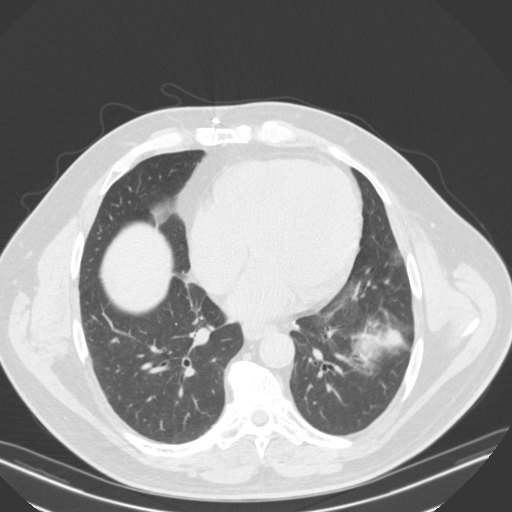
[im 76/147  lung]
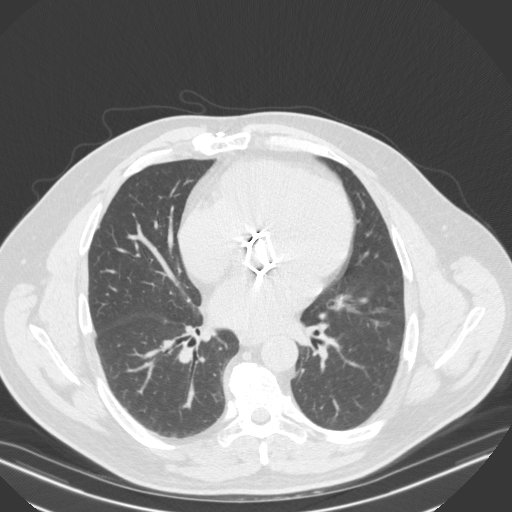
[im 82/147  mediastinal]
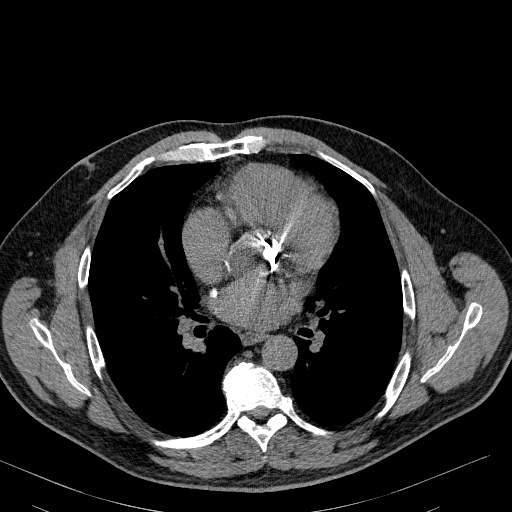
[im 82/147  lung]
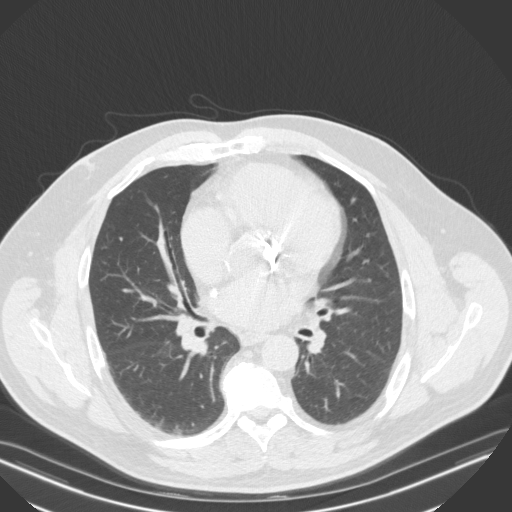
[im 88/147  lung]
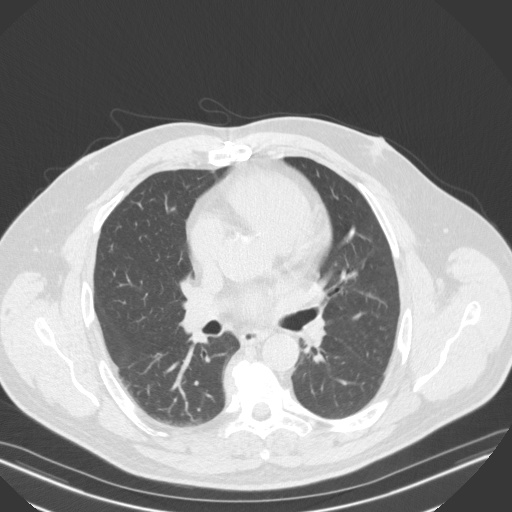
[im 98/147  lung]
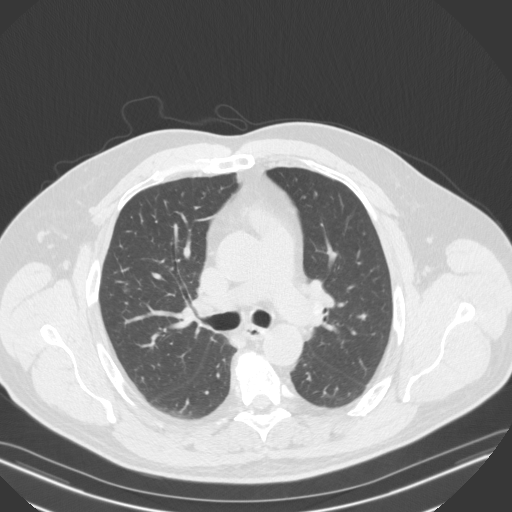
[im 109/147  lung]
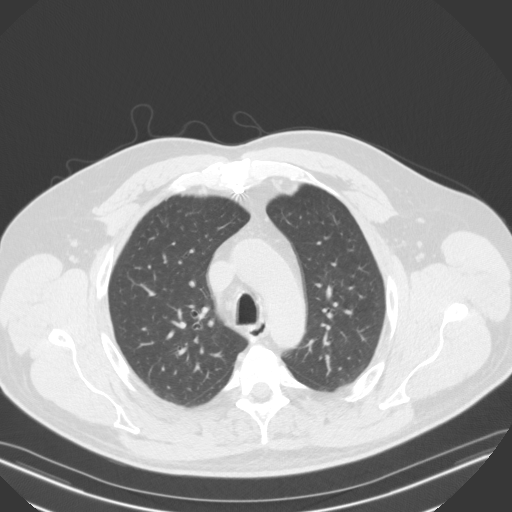
[im 117/147  mediastinal]
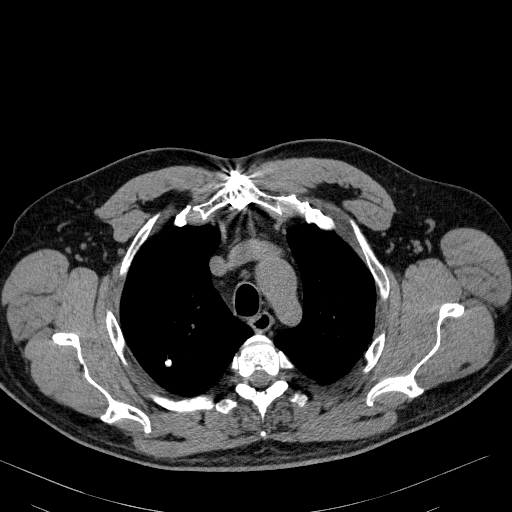
[im 117/147  lung]
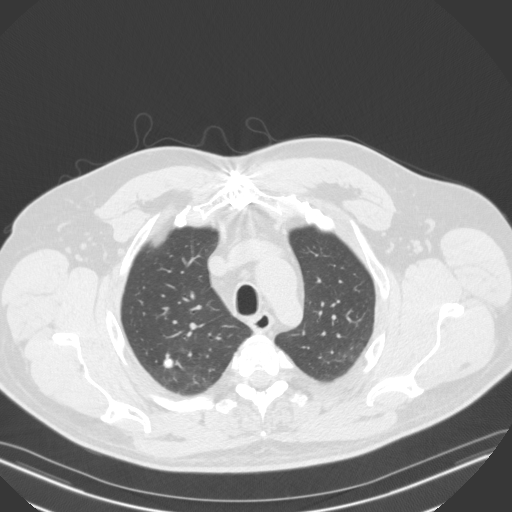
[im 125/147  lung]
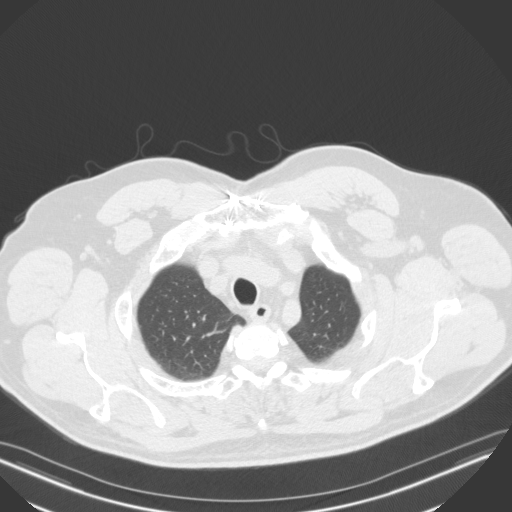
[im 136/147  lung]
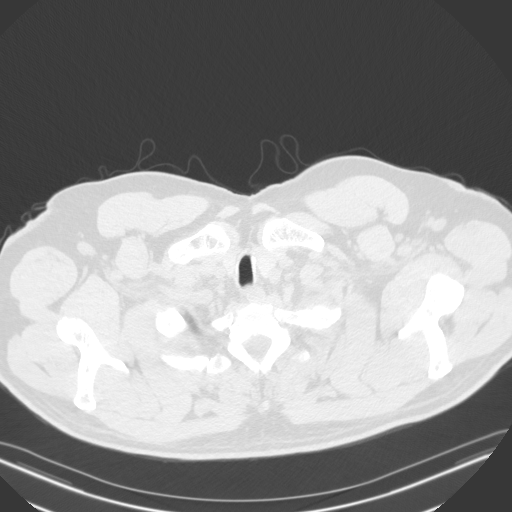

[15 of 34 positions shown; findings below may reference images not displayed]

FINDINGS: Cardiovascular: Status post aortic valve replacement. Normal heart
size. No pericardial effusion.

Mediastinum/Nodes: No enlarged mediastinal or axillary lymph nodes.
Thyroid gland, trachea, and esophagus demonstrate no significant
findings.

Lungs/Pleura: Near complete interval resolution of clustered
pulmonary nodules in the right upper lobe (series 3, image 45).
Unchanged, calcified pulmonary nodules of the bilateral upper lobes.
Scarring or atelectasis of the left lung base. No pleural effusion
or pneumothorax.

Upper Abdomen: No acute abnormality.

Musculoskeletal: No chest wall mass or suspicious bone lesions
identified.
IMPRESSION: 1. Near complete interval resolution of clustered pulmonary nodules
in the right upper lobe (series 3, image 45) consistent with
resolving infection or inflammation.

2. Other chronic and incidental findings as above and unchanged from
recent prior CT dated 09/27/2018.

## 2019-07-21 ENCOUNTER — Encounter: Payer: Self-pay | Admitting: Internal Medicine

## 2019-10-23 ENCOUNTER — Other Ambulatory Visit: Payer: Self-pay | Admitting: Endocrinology

## 2019-10-23 DIAGNOSIS — R221 Localized swelling, mass and lump, neck: Secondary | ICD-10-CM

## 2019-10-30 ENCOUNTER — Ambulatory Visit
Admission: RE | Admit: 2019-10-30 | Discharge: 2019-10-30 | Disposition: A | Discharge status: Home / Self Care | Payer: Medicare Other | Source: Ambulatory Visit | Attending: Endocrinology | Admitting: Endocrinology

## 2019-10-30 DIAGNOSIS — R221 Localized swelling, mass and lump, neck: Secondary | ICD-10-CM

## 2020-07-30 IMAGING — US US SOFT TISSUE HEAD/NECK
1 series · 14 of 25 positions shown · non-contrast
Comparison: None.

CLINICAL DATA: Left neck mass.  Subauricular pain

EXAM:
ULTRASOUND OF HEAD/NECK SOFT TISSUES
TECHNIQUE: Ultrasound examination of the head and neck soft tissues was
performed in the area of clinical concern.

[Series 1: us soft tissue head/neck · 0.07mm/px · 14 of 26 slices shown]
[im 1/26]
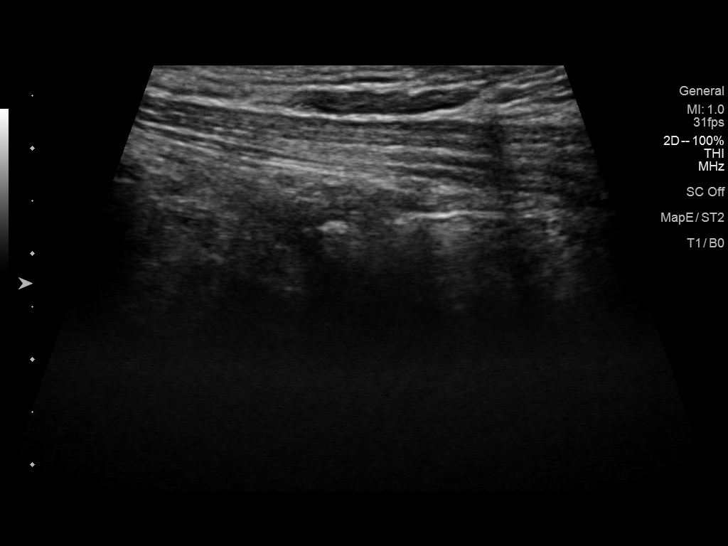
[im 3/26]
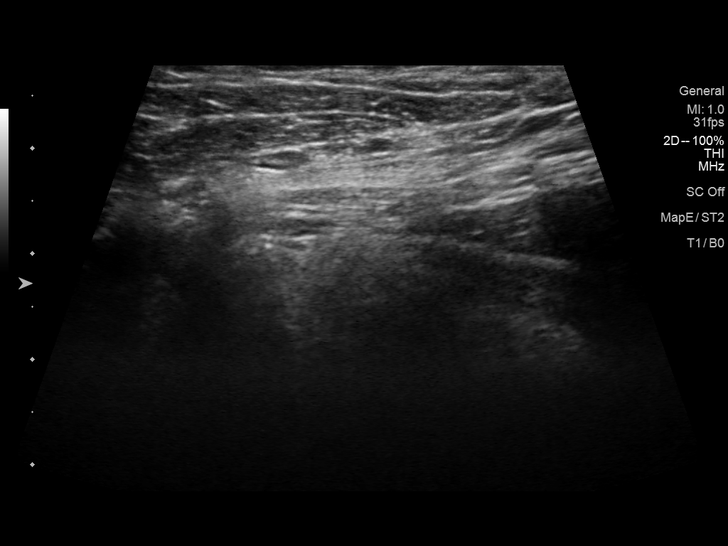
[im 5/26]
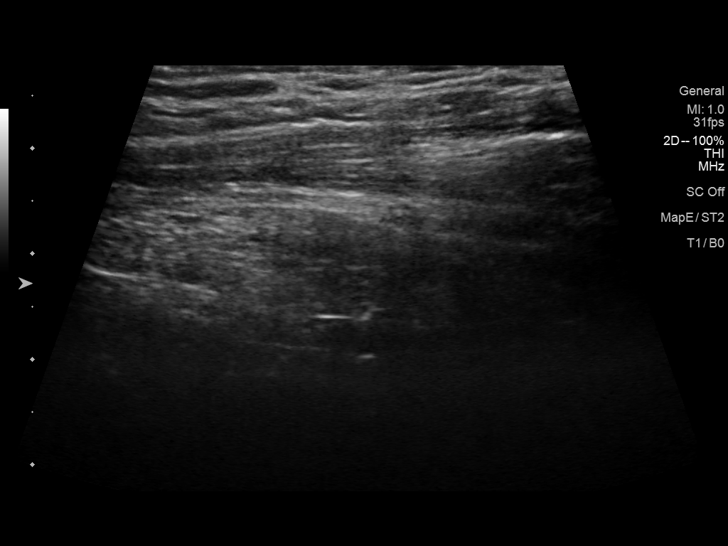
[im 7/26]
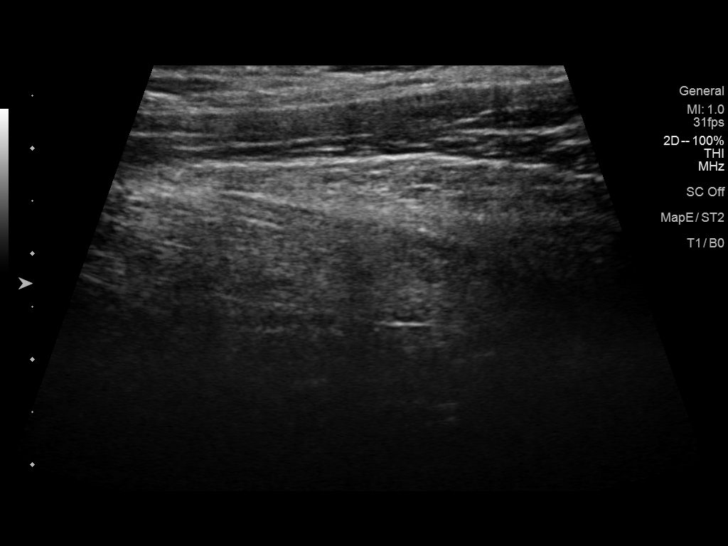
[im 9/26]
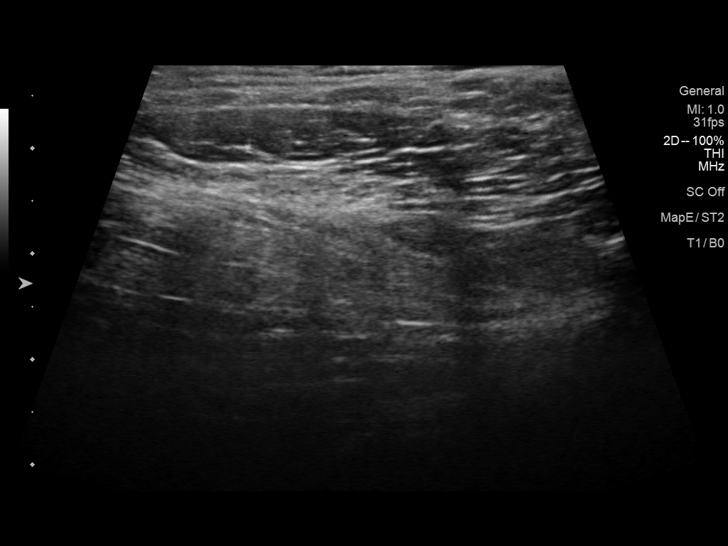
[im 10/26]
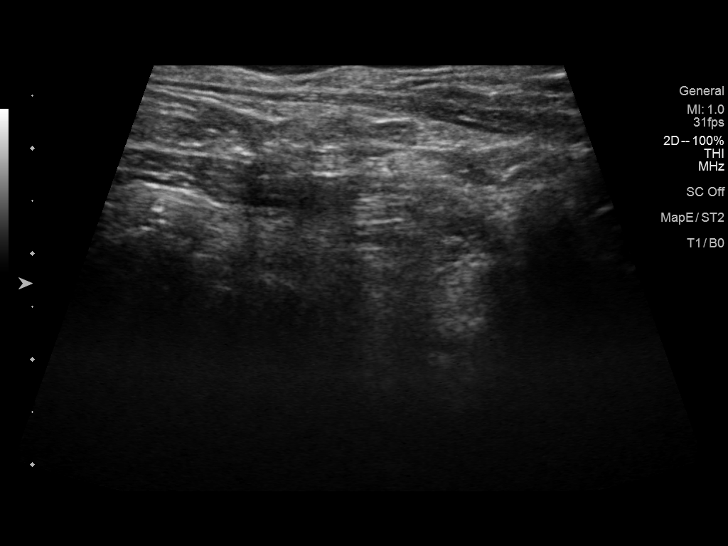
[im 12/26]
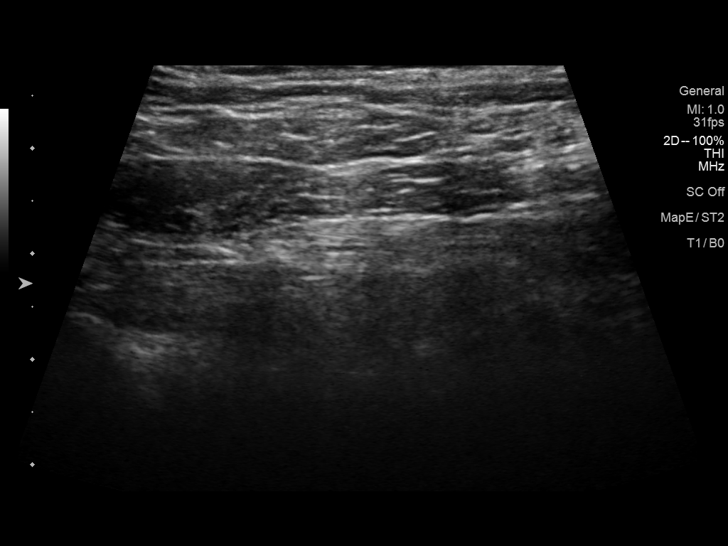
[im 14/26]
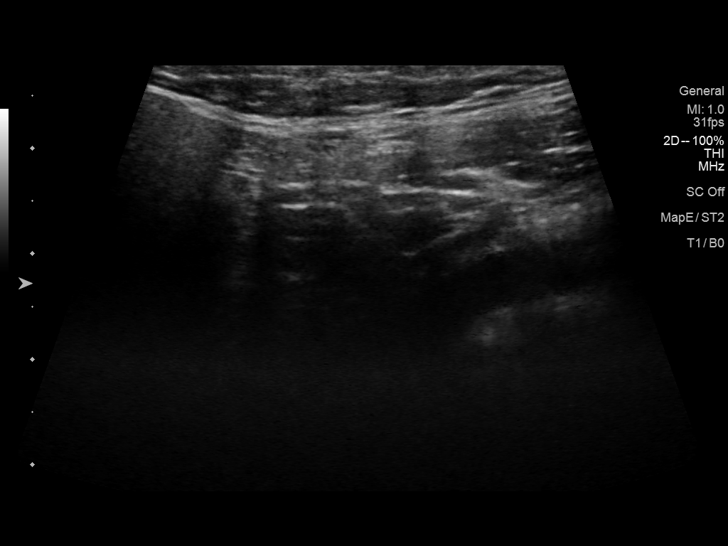
[im 16/26]
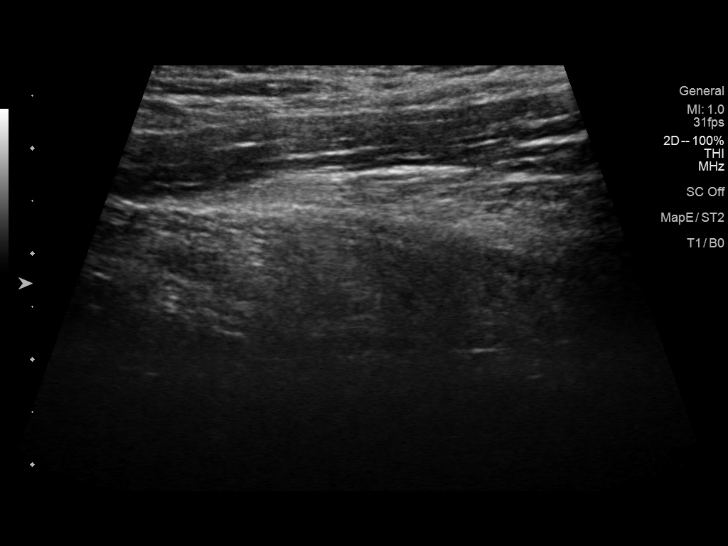
[im 17/26]
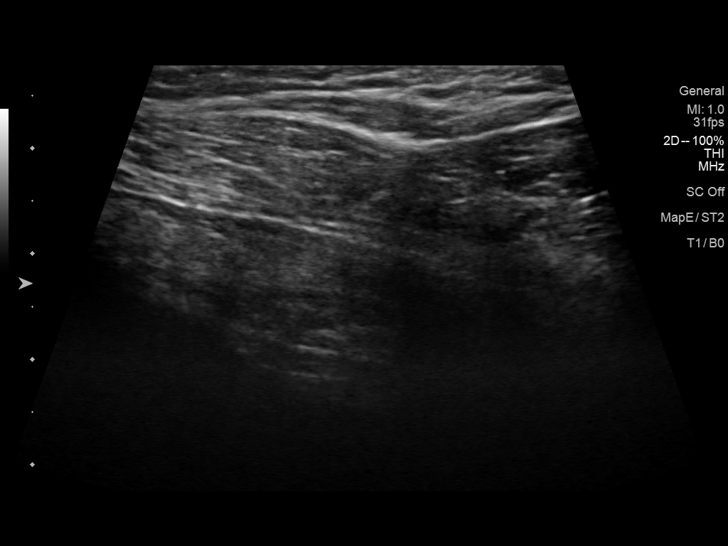
[im 19/26]
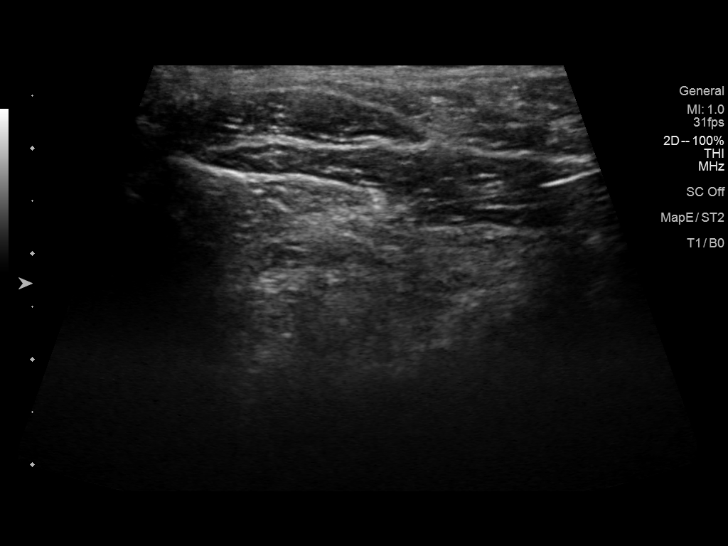
[im 21/26]
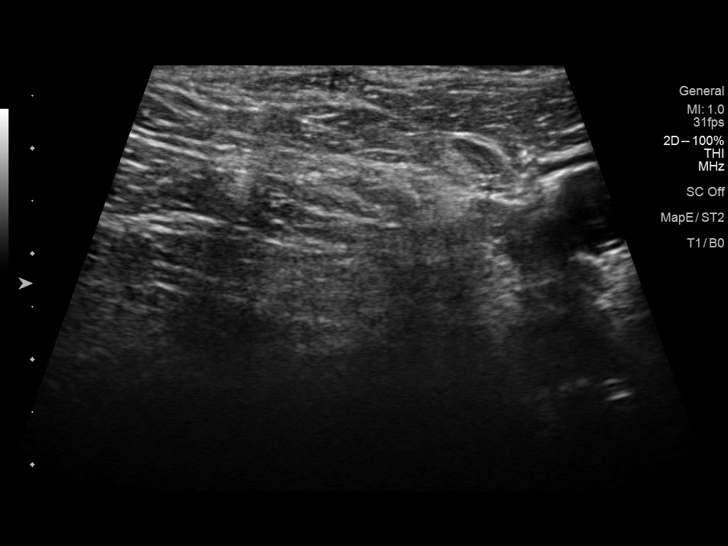
[im 23/26]
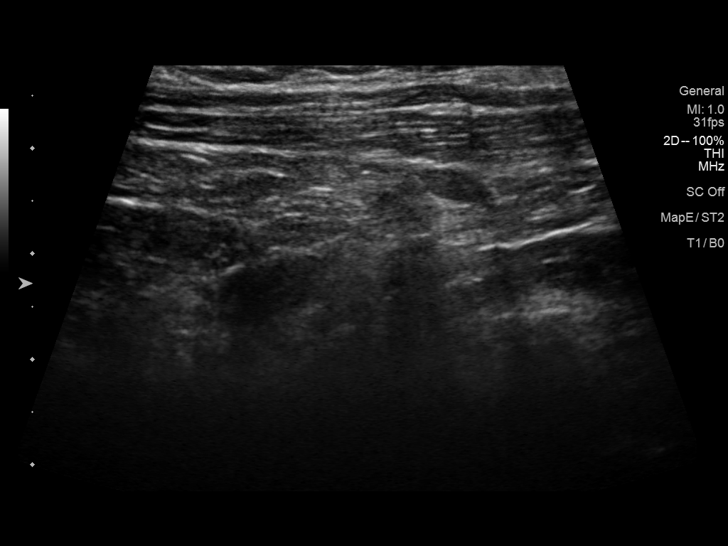
[im 26/26]
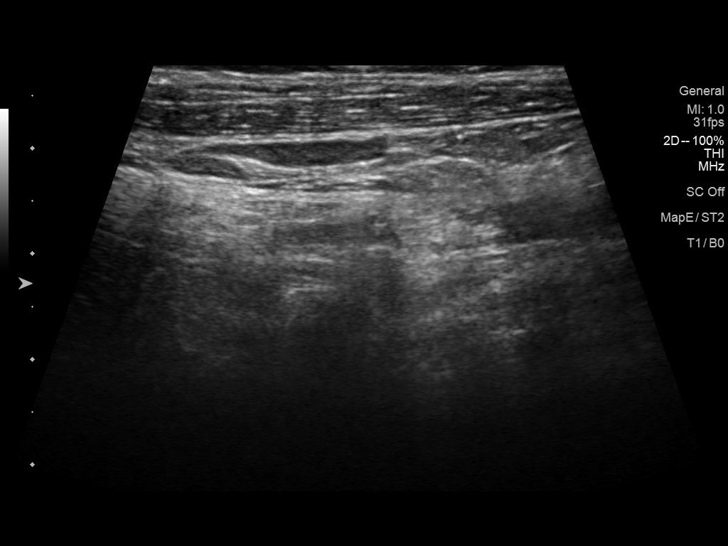

[14 of 25 positions shown; findings below may reference images not displayed]

FINDINGS: Area of pain in the region of the left sternocleidomastoid muscle.
No soft tissue mass or fluid collection. 5 mm lymph node in the area
is not pathologically enlarged.
IMPRESSION: Negative for cystic or solid mass in the left neck.

## 2021-09-10 DIAGNOSIS — E103553 Type 1 diabetes mellitus with stable proliferative diabetic retinopathy, bilateral: Secondary | ICD-10-CM | POA: Diagnosis not present

## 2021-09-10 DIAGNOSIS — Z8669 Personal history of other diseases of the nervous system and sense organs: Secondary | ICD-10-CM | POA: Diagnosis not present

## 2021-09-10 DIAGNOSIS — Z961 Presence of intraocular lens: Secondary | ICD-10-CM | POA: Diagnosis not present

## 2021-09-10 DIAGNOSIS — H26492 Other secondary cataract, left eye: Secondary | ICD-10-CM | POA: Diagnosis not present

## 2021-09-17 DIAGNOSIS — Z952 Presence of prosthetic heart valve: Secondary | ICD-10-CM | POA: Diagnosis not present

## 2021-09-17 DIAGNOSIS — E1042 Type 1 diabetes mellitus with diabetic polyneuropathy: Secondary | ICD-10-CM | POA: Diagnosis not present

## 2021-09-17 DIAGNOSIS — Z794 Long term (current) use of insulin: Secondary | ICD-10-CM | POA: Diagnosis not present

## 2021-09-17 DIAGNOSIS — Z7901 Long term (current) use of anticoagulants: Secondary | ICD-10-CM | POA: Diagnosis not present

## 2021-09-23 DIAGNOSIS — H2511 Age-related nuclear cataract, right eye: Secondary | ICD-10-CM | POA: Diagnosis not present

## 2021-09-23 DIAGNOSIS — Z01818 Encounter for other preprocedural examination: Secondary | ICD-10-CM | POA: Diagnosis not present

## 2021-09-30 DIAGNOSIS — E1065 Type 1 diabetes mellitus with hyperglycemia: Secondary | ICD-10-CM | POA: Diagnosis not present

## 2021-09-30 DIAGNOSIS — Z7901 Long term (current) use of anticoagulants: Secondary | ICD-10-CM | POA: Diagnosis not present

## 2021-09-30 DIAGNOSIS — I10 Essential (primary) hypertension: Secondary | ICD-10-CM | POA: Diagnosis not present

## 2021-09-30 DIAGNOSIS — F17210 Nicotine dependence, cigarettes, uncomplicated: Secondary | ICD-10-CM | POA: Diagnosis not present

## 2021-09-30 DIAGNOSIS — Z794 Long term (current) use of insulin: Secondary | ICD-10-CM | POA: Diagnosis not present

## 2021-09-30 DIAGNOSIS — H25811 Combined forms of age-related cataract, right eye: Secondary | ICD-10-CM | POA: Diagnosis not present

## 2021-10-01 DIAGNOSIS — E103553 Type 1 diabetes mellitus with stable proliferative diabetic retinopathy, bilateral: Secondary | ICD-10-CM | POA: Diagnosis not present

## 2021-10-01 DIAGNOSIS — Z961 Presence of intraocular lens: Secondary | ICD-10-CM | POA: Diagnosis not present

## 2021-10-01 DIAGNOSIS — Z9889 Other specified postprocedural states: Secondary | ICD-10-CM | POA: Diagnosis not present

## 2021-10-01 DIAGNOSIS — Z886 Allergy status to analgesic agent status: Secondary | ICD-10-CM | POA: Diagnosis not present

## 2021-10-01 DIAGNOSIS — Z48810 Encounter for surgical aftercare following surgery on the sense organs: Secondary | ICD-10-CM | POA: Diagnosis not present

## 2021-10-10 DIAGNOSIS — E109 Type 1 diabetes mellitus without complications: Secondary | ICD-10-CM | POA: Diagnosis not present

## 2021-10-20 DIAGNOSIS — Z961 Presence of intraocular lens: Secondary | ICD-10-CM | POA: Diagnosis not present

## 2021-10-20 DIAGNOSIS — Z48810 Encounter for surgical aftercare following surgery on the sense organs: Secondary | ICD-10-CM | POA: Diagnosis not present

## 2021-10-23 DIAGNOSIS — Z794 Long term (current) use of insulin: Secondary | ICD-10-CM | POA: Diagnosis not present

## 2021-10-23 DIAGNOSIS — Z952 Presence of prosthetic heart valve: Secondary | ICD-10-CM | POA: Diagnosis not present

## 2021-10-23 DIAGNOSIS — E1042 Type 1 diabetes mellitus with diabetic polyneuropathy: Secondary | ICD-10-CM | POA: Diagnosis not present

## 2021-10-23 DIAGNOSIS — Z7901 Long term (current) use of anticoagulants: Secondary | ICD-10-CM | POA: Diagnosis not present

## 2021-11-17 DIAGNOSIS — Z961 Presence of intraocular lens: Secondary | ICD-10-CM | POA: Diagnosis not present

## 2021-11-17 DIAGNOSIS — H26491 Other secondary cataract, right eye: Secondary | ICD-10-CM | POA: Diagnosis not present

## 2021-11-17 DIAGNOSIS — Z48810 Encounter for surgical aftercare following surgery on the sense organs: Secondary | ICD-10-CM | POA: Diagnosis not present

## 2021-12-01 DIAGNOSIS — Z952 Presence of prosthetic heart valve: Secondary | ICD-10-CM | POA: Diagnosis not present

## 2021-12-01 DIAGNOSIS — R0602 Shortness of breath: Secondary | ICD-10-CM | POA: Diagnosis not present

## 2021-12-01 DIAGNOSIS — R9431 Abnormal electrocardiogram [ECG] [EKG]: Secondary | ICD-10-CM | POA: Diagnosis not present

## 2021-12-01 DIAGNOSIS — I10 Essential (primary) hypertension: Secondary | ICD-10-CM | POA: Diagnosis not present

## 2021-12-02 DIAGNOSIS — E1042 Type 1 diabetes mellitus with diabetic polyneuropathy: Secondary | ICD-10-CM | POA: Diagnosis not present

## 2021-12-02 DIAGNOSIS — Z7901 Long term (current) use of anticoagulants: Secondary | ICD-10-CM | POA: Diagnosis not present

## 2021-12-02 DIAGNOSIS — Z794 Long term (current) use of insulin: Secondary | ICD-10-CM | POA: Diagnosis not present

## 2021-12-02 DIAGNOSIS — Z952 Presence of prosthetic heart valve: Secondary | ICD-10-CM | POA: Diagnosis not present

## 2021-12-08 DIAGNOSIS — I10 Essential (primary) hypertension: Secondary | ICD-10-CM | POA: Diagnosis not present

## 2021-12-08 DIAGNOSIS — Z952 Presence of prosthetic heart valve: Secondary | ICD-10-CM | POA: Diagnosis not present

## 2021-12-08 DIAGNOSIS — R0602 Shortness of breath: Secondary | ICD-10-CM | POA: Diagnosis not present

## 2021-12-18 DIAGNOSIS — E1042 Type 1 diabetes mellitus with diabetic polyneuropathy: Secondary | ICD-10-CM | POA: Diagnosis not present

## 2021-12-18 DIAGNOSIS — I2510 Atherosclerotic heart disease of native coronary artery without angina pectoris: Secondary | ICD-10-CM | POA: Diagnosis not present

## 2021-12-18 DIAGNOSIS — N1831 Chronic kidney disease, stage 3a: Secondary | ICD-10-CM | POA: Diagnosis not present

## 2021-12-18 DIAGNOSIS — I739 Peripheral vascular disease, unspecified: Secondary | ICD-10-CM | POA: Diagnosis not present

## 2021-12-19 DIAGNOSIS — M25531 Pain in right wrist: Secondary | ICD-10-CM | POA: Diagnosis not present

## 2021-12-19 DIAGNOSIS — W01198A Fall on same level from slipping, tripping and stumbling with subsequent striking against other object, initial encounter: Secondary | ICD-10-CM | POA: Diagnosis not present

## 2021-12-19 DIAGNOSIS — S51811A Laceration without foreign body of right forearm, initial encounter: Secondary | ICD-10-CM | POA: Diagnosis not present

## 2021-12-19 DIAGNOSIS — Z23 Encounter for immunization: Secondary | ICD-10-CM | POA: Diagnosis not present

## 2021-12-19 DIAGNOSIS — Y999 Unspecified external cause status: Secondary | ICD-10-CM | POA: Diagnosis not present

## 2021-12-23 DIAGNOSIS — R0602 Shortness of breath: Secondary | ICD-10-CM | POA: Diagnosis not present

## 2021-12-25 DIAGNOSIS — Z952 Presence of prosthetic heart valve: Secondary | ICD-10-CM | POA: Diagnosis not present

## 2021-12-25 DIAGNOSIS — R931 Abnormal findings on diagnostic imaging of heart and coronary circulation: Secondary | ICD-10-CM | POA: Diagnosis not present

## 2021-12-25 DIAGNOSIS — R0602 Shortness of breath: Secondary | ICD-10-CM | POA: Diagnosis not present

## 2021-12-25 DIAGNOSIS — I10 Essential (primary) hypertension: Secondary | ICD-10-CM | POA: Diagnosis not present

## 2022-01-01 DIAGNOSIS — R931 Abnormal findings on diagnostic imaging of heart and coronary circulation: Secondary | ICD-10-CM | POA: Diagnosis not present

## 2022-01-01 DIAGNOSIS — R0602 Shortness of breath: Secondary | ICD-10-CM | POA: Diagnosis not present

## 2022-01-02 DIAGNOSIS — E109 Type 1 diabetes mellitus without complications: Secondary | ICD-10-CM | POA: Diagnosis not present

## 2022-01-05 DIAGNOSIS — E785 Hyperlipidemia, unspecified: Secondary | ICD-10-CM | POA: Diagnosis not present

## 2022-01-05 DIAGNOSIS — I771 Stricture of artery: Secondary | ICD-10-CM | POA: Diagnosis not present

## 2022-01-05 DIAGNOSIS — Z886 Allergy status to analgesic agent status: Secondary | ICD-10-CM | POA: Diagnosis not present

## 2022-01-05 DIAGNOSIS — F17210 Nicotine dependence, cigarettes, uncomplicated: Secondary | ICD-10-CM | POA: Diagnosis not present

## 2022-01-05 DIAGNOSIS — I25110 Atherosclerotic heart disease of native coronary artery with unstable angina pectoris: Secondary | ICD-10-CM | POA: Diagnosis not present

## 2022-01-05 DIAGNOSIS — I10 Essential (primary) hypertension: Secondary | ICD-10-CM | POA: Diagnosis not present

## 2022-01-05 DIAGNOSIS — E119 Type 2 diabetes mellitus without complications: Secondary | ICD-10-CM | POA: Diagnosis not present

## 2022-01-05 DIAGNOSIS — R931 Abnormal findings on diagnostic imaging of heart and coronary circulation: Secondary | ICD-10-CM | POA: Diagnosis not present

## 2022-01-05 DIAGNOSIS — Z7901 Long term (current) use of anticoagulants: Secondary | ICD-10-CM | POA: Diagnosis not present

## 2022-01-05 DIAGNOSIS — Z794 Long term (current) use of insulin: Secondary | ICD-10-CM | POA: Diagnosis not present

## 2022-01-05 DIAGNOSIS — Z79899 Other long term (current) drug therapy: Secondary | ICD-10-CM | POA: Diagnosis not present

## 2022-01-05 DIAGNOSIS — I2583 Coronary atherosclerosis due to lipid rich plaque: Secondary | ICD-10-CM | POA: Diagnosis not present

## 2022-01-05 DIAGNOSIS — Z952 Presence of prosthetic heart valve: Secondary | ICD-10-CM | POA: Diagnosis not present

## 2022-01-05 DIAGNOSIS — F32A Depression, unspecified: Secondary | ICD-10-CM | POA: Diagnosis not present

## 2022-01-05 DIAGNOSIS — Z6833 Body mass index (BMI) 33.0-33.9, adult: Secondary | ICD-10-CM | POA: Diagnosis not present

## 2022-01-05 DIAGNOSIS — F419 Anxiety disorder, unspecified: Secondary | ICD-10-CM | POA: Diagnosis not present

## 2022-01-05 DIAGNOSIS — E669 Obesity, unspecified: Secondary | ICD-10-CM | POA: Diagnosis not present

## 2022-01-05 DIAGNOSIS — I081 Rheumatic disorders of both mitral and tricuspid valves: Secondary | ICD-10-CM | POA: Diagnosis not present

## 2022-01-06 DIAGNOSIS — Z794 Long term (current) use of insulin: Secondary | ICD-10-CM | POA: Diagnosis not present

## 2022-01-06 DIAGNOSIS — F32A Depression, unspecified: Secondary | ICD-10-CM | POA: Diagnosis not present

## 2022-01-06 DIAGNOSIS — F17210 Nicotine dependence, cigarettes, uncomplicated: Secondary | ICD-10-CM | POA: Diagnosis not present

## 2022-01-06 DIAGNOSIS — F419 Anxiety disorder, unspecified: Secondary | ICD-10-CM | POA: Diagnosis not present

## 2022-01-06 DIAGNOSIS — Z6833 Body mass index (BMI) 33.0-33.9, adult: Secondary | ICD-10-CM | POA: Diagnosis not present

## 2022-01-06 DIAGNOSIS — Z7901 Long term (current) use of anticoagulants: Secondary | ICD-10-CM | POA: Diagnosis not present

## 2022-01-06 DIAGNOSIS — Z79899 Other long term (current) drug therapy: Secondary | ICD-10-CM | POA: Diagnosis not present

## 2022-01-06 DIAGNOSIS — I25110 Atherosclerotic heart disease of native coronary artery with unstable angina pectoris: Secondary | ICD-10-CM | POA: Diagnosis not present

## 2022-01-06 DIAGNOSIS — E119 Type 2 diabetes mellitus without complications: Secondary | ICD-10-CM | POA: Diagnosis not present

## 2022-01-06 DIAGNOSIS — I10 Essential (primary) hypertension: Secondary | ICD-10-CM | POA: Diagnosis not present

## 2022-01-06 DIAGNOSIS — E669 Obesity, unspecified: Secondary | ICD-10-CM | POA: Diagnosis not present

## 2022-01-06 DIAGNOSIS — Z952 Presence of prosthetic heart valve: Secondary | ICD-10-CM | POA: Diagnosis not present

## 2022-01-06 DIAGNOSIS — Z886 Allergy status to analgesic agent status: Secondary | ICD-10-CM | POA: Diagnosis not present

## 2022-01-06 DIAGNOSIS — I081 Rheumatic disorders of both mitral and tricuspid valves: Secondary | ICD-10-CM | POA: Diagnosis not present

## 2022-01-06 DIAGNOSIS — E785 Hyperlipidemia, unspecified: Secondary | ICD-10-CM | POA: Diagnosis not present

## 2022-01-09 DIAGNOSIS — N1831 Chronic kidney disease, stage 3a: Secondary | ICD-10-CM | POA: Diagnosis not present

## 2022-01-09 DIAGNOSIS — F17200 Nicotine dependence, unspecified, uncomplicated: Secondary | ICD-10-CM | POA: Diagnosis not present

## 2022-01-09 DIAGNOSIS — E785 Hyperlipidemia, unspecified: Secondary | ICD-10-CM | POA: Diagnosis not present

## 2022-01-09 DIAGNOSIS — I129 Hypertensive chronic kidney disease with stage 1 through stage 4 chronic kidney disease, or unspecified chronic kidney disease: Secondary | ICD-10-CM | POA: Diagnosis not present

## 2022-01-09 DIAGNOSIS — E876 Hypokalemia: Secondary | ICD-10-CM | POA: Diagnosis not present

## 2022-01-09 DIAGNOSIS — I25119 Atherosclerotic heart disease of native coronary artery with unspecified angina pectoris: Secondary | ICD-10-CM | POA: Diagnosis not present

## 2022-01-09 DIAGNOSIS — I10 Essential (primary) hypertension: Secondary | ICD-10-CM | POA: Diagnosis not present

## 2022-01-09 DIAGNOSIS — R7989 Other specified abnormal findings of blood chemistry: Secondary | ICD-10-CM | POA: Diagnosis not present

## 2022-01-19 DIAGNOSIS — H26491 Other secondary cataract, right eye: Secondary | ICD-10-CM | POA: Diagnosis not present

## 2022-01-19 DIAGNOSIS — Z961 Presence of intraocular lens: Secondary | ICD-10-CM | POA: Diagnosis not present

## 2022-03-05 DIAGNOSIS — E1042 Type 1 diabetes mellitus with diabetic polyneuropathy: Secondary | ICD-10-CM | POA: Diagnosis not present

## 2022-03-05 DIAGNOSIS — Z794 Long term (current) use of insulin: Secondary | ICD-10-CM | POA: Diagnosis not present

## 2022-03-05 DIAGNOSIS — Z952 Presence of prosthetic heart valve: Secondary | ICD-10-CM | POA: Diagnosis not present

## 2022-03-05 DIAGNOSIS — Z7901 Long term (current) use of anticoagulants: Secondary | ICD-10-CM | POA: Diagnosis not present

## 2022-03-11 DIAGNOSIS — I700 Atherosclerosis of aorta: Secondary | ICD-10-CM | POA: Diagnosis not present

## 2022-03-11 DIAGNOSIS — N1831 Chronic kidney disease, stage 3a: Secondary | ICD-10-CM | POA: Diagnosis not present

## 2022-03-11 DIAGNOSIS — E1042 Type 1 diabetes mellitus with diabetic polyneuropathy: Secondary | ICD-10-CM | POA: Diagnosis not present

## 2022-03-11 DIAGNOSIS — I2510 Atherosclerotic heart disease of native coronary artery without angina pectoris: Secondary | ICD-10-CM | POA: Diagnosis not present

## 2022-03-23 DIAGNOSIS — Z961 Presence of intraocular lens: Secondary | ICD-10-CM | POA: Diagnosis not present

## 2022-03-23 DIAGNOSIS — Z48810 Encounter for surgical aftercare following surgery on the sense organs: Secondary | ICD-10-CM | POA: Diagnosis not present

## 2022-03-27 DIAGNOSIS — H524 Presbyopia: Secondary | ICD-10-CM | POA: Diagnosis not present

## 2022-03-31 DIAGNOSIS — Z952 Presence of prosthetic heart valve: Secondary | ICD-10-CM | POA: Diagnosis not present

## 2022-03-31 DIAGNOSIS — I10 Essential (primary) hypertension: Secondary | ICD-10-CM | POA: Diagnosis not present

## 2022-03-31 DIAGNOSIS — I2510 Atherosclerotic heart disease of native coronary artery without angina pectoris: Secondary | ICD-10-CM | POA: Diagnosis not present

## 2022-03-31 DIAGNOSIS — R0602 Shortness of breath: Secondary | ICD-10-CM | POA: Diagnosis not present

## 2022-04-09 DIAGNOSIS — Z952 Presence of prosthetic heart valve: Secondary | ICD-10-CM | POA: Diagnosis not present

## 2022-04-09 DIAGNOSIS — Z794 Long term (current) use of insulin: Secondary | ICD-10-CM | POA: Diagnosis not present

## 2022-04-09 DIAGNOSIS — Z7901 Long term (current) use of anticoagulants: Secondary | ICD-10-CM | POA: Diagnosis not present

## 2022-04-09 DIAGNOSIS — E1042 Type 1 diabetes mellitus with diabetic polyneuropathy: Secondary | ICD-10-CM | POA: Diagnosis not present

## 2022-05-18 DIAGNOSIS — E109 Type 1 diabetes mellitus without complications: Secondary | ICD-10-CM | POA: Diagnosis not present

## 2022-05-19 DIAGNOSIS — Z952 Presence of prosthetic heart valve: Secondary | ICD-10-CM | POA: Diagnosis not present

## 2022-05-19 DIAGNOSIS — Z7901 Long term (current) use of anticoagulants: Secondary | ICD-10-CM | POA: Diagnosis not present

## 2022-06-15 DIAGNOSIS — R0602 Shortness of breath: Secondary | ICD-10-CM | POA: Diagnosis not present

## 2022-06-15 DIAGNOSIS — Z952 Presence of prosthetic heart valve: Secondary | ICD-10-CM | POA: Diagnosis not present

## 2022-06-15 DIAGNOSIS — I2510 Atherosclerotic heart disease of native coronary artery without angina pectoris: Secondary | ICD-10-CM | POA: Diagnosis not present

## 2022-06-15 DIAGNOSIS — I10 Essential (primary) hypertension: Secondary | ICD-10-CM | POA: Diagnosis not present

## 2022-06-15 DIAGNOSIS — Z7901 Long term (current) use of anticoagulants: Secondary | ICD-10-CM | POA: Diagnosis not present

## 2022-07-23 DIAGNOSIS — J8410 Pulmonary fibrosis, unspecified: Secondary | ICD-10-CM | POA: Diagnosis not present

## 2022-07-23 DIAGNOSIS — E1042 Type 1 diabetes mellitus with diabetic polyneuropathy: Secondary | ICD-10-CM | POA: Diagnosis not present

## 2022-07-23 DIAGNOSIS — E785 Hyperlipidemia, unspecified: Secondary | ICD-10-CM | POA: Diagnosis not present

## 2022-07-23 DIAGNOSIS — N1831 Chronic kidney disease, stage 3a: Secondary | ICD-10-CM | POA: Diagnosis not present

## 2022-07-27 DIAGNOSIS — Z7901 Long term (current) use of anticoagulants: Secondary | ICD-10-CM | POA: Diagnosis not present

## 2022-08-27 DIAGNOSIS — E1042 Type 1 diabetes mellitus with diabetic polyneuropathy: Secondary | ICD-10-CM | POA: Diagnosis not present

## 2022-08-27 DIAGNOSIS — R791 Abnormal coagulation profile: Secondary | ICD-10-CM | POA: Diagnosis not present

## 2022-08-27 DIAGNOSIS — Z7901 Long term (current) use of anticoagulants: Secondary | ICD-10-CM | POA: Diagnosis not present

## 2022-08-27 DIAGNOSIS — Z952 Presence of prosthetic heart valve: Secondary | ICD-10-CM | POA: Diagnosis not present

## 2022-09-01 DIAGNOSIS — Z7901 Long term (current) use of anticoagulants: Secondary | ICD-10-CM | POA: Diagnosis not present

## 2022-09-01 DIAGNOSIS — M545 Low back pain, unspecified: Secondary | ICD-10-CM | POA: Diagnosis not present

## 2022-09-01 DIAGNOSIS — R791 Abnormal coagulation profile: Secondary | ICD-10-CM | POA: Diagnosis not present

## 2022-09-01 DIAGNOSIS — Z952 Presence of prosthetic heart valve: Secondary | ICD-10-CM | POA: Diagnosis not present

## 2022-09-08 DIAGNOSIS — M545 Low back pain, unspecified: Secondary | ICD-10-CM | POA: Diagnosis not present

## 2022-09-08 DIAGNOSIS — Z7901 Long term (current) use of anticoagulants: Secondary | ICD-10-CM | POA: Diagnosis not present

## 2022-09-08 DIAGNOSIS — R791 Abnormal coagulation profile: Secondary | ICD-10-CM | POA: Diagnosis not present

## 2022-09-08 DIAGNOSIS — Z952 Presence of prosthetic heart valve: Secondary | ICD-10-CM | POA: Diagnosis not present

## 2022-09-21 DIAGNOSIS — R791 Abnormal coagulation profile: Secondary | ICD-10-CM | POA: Diagnosis not present

## 2022-09-21 DIAGNOSIS — Z952 Presence of prosthetic heart valve: Secondary | ICD-10-CM | POA: Diagnosis not present

## 2022-10-17 DIAGNOSIS — Z720 Tobacco use: Secondary | ICD-10-CM | POA: Diagnosis not present

## 2022-10-17 DIAGNOSIS — I509 Heart failure, unspecified: Secondary | ICD-10-CM | POA: Diagnosis not present

## 2022-10-17 DIAGNOSIS — J811 Chronic pulmonary edema: Secondary | ICD-10-CM | POA: Diagnosis not present

## 2022-10-17 DIAGNOSIS — Z6834 Body mass index (BMI) 34.0-34.9, adult: Secondary | ICD-10-CM | POA: Diagnosis not present

## 2022-10-17 DIAGNOSIS — E669 Obesity, unspecified: Secondary | ICD-10-CM | POA: Diagnosis not present

## 2022-10-17 DIAGNOSIS — N179 Acute kidney failure, unspecified: Secondary | ICD-10-CM | POA: Diagnosis not present

## 2022-10-17 DIAGNOSIS — E785 Hyperlipidemia, unspecified: Secondary | ICD-10-CM | POA: Diagnosis not present

## 2022-10-17 DIAGNOSIS — Z954 Presence of other heart-valve replacement: Secondary | ICD-10-CM | POA: Diagnosis not present

## 2022-10-17 DIAGNOSIS — Z87891 Personal history of nicotine dependence: Secondary | ICD-10-CM | POA: Diagnosis not present

## 2022-10-17 DIAGNOSIS — R079 Chest pain, unspecified: Secondary | ICD-10-CM | POA: Diagnosis not present

## 2022-10-17 DIAGNOSIS — R0981 Nasal congestion: Secondary | ICD-10-CM | POA: Diagnosis not present

## 2022-10-17 DIAGNOSIS — R062 Wheezing: Secondary | ICD-10-CM | POA: Diagnosis not present

## 2022-10-17 DIAGNOSIS — Z955 Presence of coronary angioplasty implant and graft: Secondary | ICD-10-CM | POA: Diagnosis not present

## 2022-10-17 DIAGNOSIS — I160 Hypertensive urgency: Secondary | ICD-10-CM | POA: Diagnosis not present

## 2022-10-17 DIAGNOSIS — Z888 Allergy status to other drugs, medicaments and biological substances status: Secondary | ICD-10-CM | POA: Diagnosis not present

## 2022-10-17 DIAGNOSIS — E876 Hypokalemia: Secondary | ICD-10-CM | POA: Diagnosis not present

## 2022-10-17 DIAGNOSIS — D649 Anemia, unspecified: Secondary | ICD-10-CM | POA: Diagnosis not present

## 2022-10-17 DIAGNOSIS — Z952 Presence of prosthetic heart valve: Secondary | ICD-10-CM | POA: Diagnosis not present

## 2022-10-17 DIAGNOSIS — I2510 Atherosclerotic heart disease of native coronary artery without angina pectoris: Secondary | ICD-10-CM | POA: Diagnosis not present

## 2022-10-17 DIAGNOSIS — E119 Type 2 diabetes mellitus without complications: Secondary | ICD-10-CM | POA: Diagnosis not present

## 2022-10-17 DIAGNOSIS — D6869 Other thrombophilia: Secondary | ICD-10-CM | POA: Diagnosis not present

## 2022-10-17 DIAGNOSIS — I77810 Thoracic aortic ectasia: Secondary | ICD-10-CM | POA: Diagnosis not present

## 2022-10-17 DIAGNOSIS — I5031 Acute diastolic (congestive) heart failure: Secondary | ICD-10-CM | POA: Diagnosis not present

## 2022-10-17 DIAGNOSIS — I25110 Atherosclerotic heart disease of native coronary artery with unstable angina pectoris: Secondary | ICD-10-CM | POA: Diagnosis not present

## 2022-10-17 DIAGNOSIS — I081 Rheumatic disorders of both mitral and tricuspid valves: Secondary | ICD-10-CM | POA: Diagnosis not present

## 2022-10-17 DIAGNOSIS — R0602 Shortness of breath: Secondary | ICD-10-CM | POA: Diagnosis not present

## 2022-10-17 DIAGNOSIS — F32A Depression, unspecified: Secondary | ICD-10-CM | POA: Diagnosis not present

## 2022-10-17 DIAGNOSIS — Z953 Presence of xenogenic heart valve: Secondary | ICD-10-CM | POA: Diagnosis not present

## 2022-10-17 DIAGNOSIS — Z8616 Personal history of COVID-19: Secondary | ICD-10-CM | POA: Diagnosis not present

## 2022-10-17 DIAGNOSIS — I110 Hypertensive heart disease with heart failure: Secondary | ICD-10-CM | POA: Diagnosis not present

## 2022-10-17 DIAGNOSIS — F419 Anxiety disorder, unspecified: Secondary | ICD-10-CM | POA: Diagnosis not present

## 2022-10-22 DIAGNOSIS — U071 COVID-19: Secondary | ICD-10-CM | POA: Diagnosis not present

## 2022-10-22 DIAGNOSIS — Z1152 Encounter for screening for COVID-19: Secondary | ICD-10-CM | POA: Diagnosis not present

## 2022-10-22 DIAGNOSIS — E1042 Type 1 diabetes mellitus with diabetic polyneuropathy: Secondary | ICD-10-CM | POA: Diagnosis not present

## 2022-10-22 DIAGNOSIS — Z952 Presence of prosthetic heart valve: Secondary | ICD-10-CM | POA: Diagnosis not present

## 2022-10-22 DIAGNOSIS — Z7901 Long term (current) use of anticoagulants: Secondary | ICD-10-CM | POA: Diagnosis not present

## 2022-10-22 DIAGNOSIS — I130 Hypertensive heart and chronic kidney disease with heart failure and stage 1 through stage 4 chronic kidney disease, or unspecified chronic kidney disease: Secondary | ICD-10-CM | POA: Diagnosis not present

## 2022-10-22 DIAGNOSIS — I509 Heart failure, unspecified: Secondary | ICD-10-CM | POA: Diagnosis not present

## 2022-10-22 DIAGNOSIS — R0981 Nasal congestion: Secondary | ICD-10-CM | POA: Diagnosis not present

## 2022-10-22 DIAGNOSIS — R531 Weakness: Secondary | ICD-10-CM | POA: Diagnosis not present

## 2022-11-03 DIAGNOSIS — Z955 Presence of coronary angioplasty implant and graft: Secondary | ICD-10-CM | POA: Diagnosis not present

## 2022-11-03 DIAGNOSIS — Z952 Presence of prosthetic heart valve: Secondary | ICD-10-CM | POA: Diagnosis not present

## 2022-11-03 DIAGNOSIS — I25110 Atherosclerotic heart disease of native coronary artery with unstable angina pectoris: Secondary | ICD-10-CM | POA: Diagnosis not present

## 2022-11-03 DIAGNOSIS — Z1330 Encounter for screening examination for mental health and behavioral disorders, unspecified: Secondary | ICD-10-CM | POA: Diagnosis not present

## 2022-11-03 DIAGNOSIS — I10 Essential (primary) hypertension: Secondary | ICD-10-CM | POA: Diagnosis not present

## 2022-11-03 DIAGNOSIS — I5032 Chronic diastolic (congestive) heart failure: Secondary | ICD-10-CM | POA: Diagnosis not present

## 2022-11-10 DIAGNOSIS — I5032 Chronic diastolic (congestive) heart failure: Secondary | ICD-10-CM | POA: Diagnosis not present

## 2022-11-16 DIAGNOSIS — K080 Exfoliation of teeth due to systemic causes: Secondary | ICD-10-CM | POA: Diagnosis not present

## 2022-11-18 DIAGNOSIS — E785 Hyperlipidemia, unspecified: Secondary | ICD-10-CM | POA: Diagnosis not present

## 2022-11-18 DIAGNOSIS — D649 Anemia, unspecified: Secondary | ICD-10-CM | POA: Diagnosis not present

## 2022-11-18 DIAGNOSIS — Z794 Long term (current) use of insulin: Secondary | ICD-10-CM | POA: Diagnosis not present

## 2022-11-18 DIAGNOSIS — I10 Essential (primary) hypertension: Secondary | ICD-10-CM | POA: Diagnosis not present

## 2022-11-18 DIAGNOSIS — E119 Type 2 diabetes mellitus without complications: Secondary | ICD-10-CM | POA: Diagnosis not present

## 2022-11-18 DIAGNOSIS — Z952 Presence of prosthetic heart valve: Secondary | ICD-10-CM | POA: Diagnosis not present

## 2022-11-18 DIAGNOSIS — I5033 Acute on chronic diastolic (congestive) heart failure: Secondary | ICD-10-CM | POA: Diagnosis not present

## 2022-11-18 DIAGNOSIS — N1831 Chronic kidney disease, stage 3a: Secondary | ICD-10-CM | POA: Diagnosis not present

## 2022-12-01 DIAGNOSIS — I129 Hypertensive chronic kidney disease with stage 1 through stage 4 chronic kidney disease, or unspecified chronic kidney disease: Secondary | ICD-10-CM | POA: Diagnosis not present

## 2022-12-01 DIAGNOSIS — N1831 Chronic kidney disease, stage 3a: Secondary | ICD-10-CM | POA: Diagnosis not present

## 2022-12-01 DIAGNOSIS — Z7901 Long term (current) use of anticoagulants: Secondary | ICD-10-CM | POA: Diagnosis not present

## 2022-12-01 DIAGNOSIS — I2510 Atherosclerotic heart disease of native coronary artery without angina pectoris: Secondary | ICD-10-CM | POA: Diagnosis not present

## 2022-12-01 DIAGNOSIS — E1042 Type 1 diabetes mellitus with diabetic polyneuropathy: Secondary | ICD-10-CM | POA: Diagnosis not present

## 2022-12-01 DIAGNOSIS — I700 Atherosclerosis of aorta: Secondary | ICD-10-CM | POA: Diagnosis not present

## 2022-12-06 DIAGNOSIS — W19XXXA Unspecified fall, initial encounter: Secondary | ICD-10-CM | POA: Diagnosis not present

## 2022-12-06 DIAGNOSIS — S01112A Laceration without foreign body of left eyelid and periocular area, initial encounter: Secondary | ICD-10-CM | POA: Diagnosis not present

## 2022-12-06 DIAGNOSIS — I440 Atrioventricular block, first degree: Secondary | ICD-10-CM | POA: Diagnosis not present

## 2022-12-06 DIAGNOSIS — R55 Syncope and collapse: Secondary | ICD-10-CM | POA: Diagnosis not present

## 2022-12-06 DIAGNOSIS — S199XXA Unspecified injury of neck, initial encounter: Secondary | ICD-10-CM | POA: Diagnosis not present

## 2022-12-06 DIAGNOSIS — E162 Hypoglycemia, unspecified: Secondary | ICD-10-CM | POA: Diagnosis not present

## 2022-12-06 DIAGNOSIS — M2578 Osteophyte, vertebrae: Secondary | ICD-10-CM | POA: Diagnosis not present

## 2022-12-06 DIAGNOSIS — E669 Obesity, unspecified: Secondary | ICD-10-CM | POA: Diagnosis not present

## 2022-12-06 DIAGNOSIS — S0191XA Laceration without foreign body of unspecified part of head, initial encounter: Secondary | ICD-10-CM | POA: Diagnosis not present

## 2022-12-06 DIAGNOSIS — R059 Cough, unspecified: Secondary | ICD-10-CM | POA: Diagnosis not present

## 2022-12-06 DIAGNOSIS — R6884 Jaw pain: Secondary | ICD-10-CM | POA: Diagnosis not present

## 2022-12-06 DIAGNOSIS — S0181XA Laceration without foreign body of other part of head, initial encounter: Secondary | ICD-10-CM | POA: Diagnosis not present

## 2022-12-06 DIAGNOSIS — R058 Other specified cough: Secondary | ICD-10-CM | POA: Diagnosis not present

## 2022-12-10 DIAGNOSIS — Z952 Presence of prosthetic heart valve: Secondary | ICD-10-CM | POA: Diagnosis not present

## 2022-12-10 DIAGNOSIS — Z794 Long term (current) use of insulin: Secondary | ICD-10-CM | POA: Diagnosis not present

## 2022-12-10 DIAGNOSIS — E785 Hyperlipidemia, unspecified: Secondary | ICD-10-CM | POA: Diagnosis not present

## 2022-12-10 DIAGNOSIS — N1831 Chronic kidney disease, stage 3a: Secondary | ICD-10-CM | POA: Diagnosis not present

## 2022-12-10 DIAGNOSIS — R748 Abnormal levels of other serum enzymes: Secondary | ICD-10-CM | POA: Diagnosis not present

## 2022-12-10 DIAGNOSIS — I5033 Acute on chronic diastolic (congestive) heart failure: Secondary | ICD-10-CM | POA: Diagnosis not present

## 2022-12-10 DIAGNOSIS — E119 Type 2 diabetes mellitus without complications: Secondary | ICD-10-CM | POA: Diagnosis not present

## 2022-12-10 DIAGNOSIS — I2510 Atherosclerotic heart disease of native coronary artery without angina pectoris: Secondary | ICD-10-CM | POA: Diagnosis not present

## 2022-12-10 DIAGNOSIS — Z9861 Coronary angioplasty status: Secondary | ICD-10-CM | POA: Diagnosis not present

## 2022-12-17 DIAGNOSIS — R55 Syncope and collapse: Secondary | ICD-10-CM | POA: Diagnosis not present

## 2022-12-17 DIAGNOSIS — I129 Hypertensive chronic kidney disease with stage 1 through stage 4 chronic kidney disease, or unspecified chronic kidney disease: Secondary | ICD-10-CM | POA: Diagnosis not present

## 2022-12-17 DIAGNOSIS — S0181XA Laceration without foreign body of other part of head, initial encounter: Secondary | ICD-10-CM | POA: Diagnosis not present

## 2022-12-17 DIAGNOSIS — S0990XA Unspecified injury of head, initial encounter: Secondary | ICD-10-CM | POA: Diagnosis not present

## 2023-01-01 DIAGNOSIS — D6859 Other primary thrombophilia: Secondary | ICD-10-CM | POA: Diagnosis not present

## 2023-01-01 DIAGNOSIS — Z952 Presence of prosthetic heart valve: Secondary | ICD-10-CM | POA: Diagnosis not present

## 2023-01-01 DIAGNOSIS — Z7901 Long term (current) use of anticoagulants: Secondary | ICD-10-CM | POA: Diagnosis not present

## 2023-01-26 DIAGNOSIS — D6859 Other primary thrombophilia: Secondary | ICD-10-CM | POA: Diagnosis not present

## 2023-01-26 DIAGNOSIS — Z7901 Long term (current) use of anticoagulants: Secondary | ICD-10-CM | POA: Diagnosis not present

## 2023-01-26 DIAGNOSIS — Z952 Presence of prosthetic heart valve: Secondary | ICD-10-CM | POA: Diagnosis not present

## 2023-02-04 DIAGNOSIS — Z952 Presence of prosthetic heart valve: Secondary | ICD-10-CM | POA: Diagnosis not present

## 2023-02-04 DIAGNOSIS — I2510 Atherosclerotic heart disease of native coronary artery without angina pectoris: Secondary | ICD-10-CM | POA: Diagnosis not present

## 2023-02-04 DIAGNOSIS — R0602 Shortness of breath: Secondary | ICD-10-CM | POA: Diagnosis not present

## 2023-02-04 DIAGNOSIS — I10 Essential (primary) hypertension: Secondary | ICD-10-CM | POA: Diagnosis not present

## 2023-02-16 DIAGNOSIS — D6859 Other primary thrombophilia: Secondary | ICD-10-CM | POA: Diagnosis not present

## 2023-03-22 DIAGNOSIS — Z952 Presence of prosthetic heart valve: Secondary | ICD-10-CM | POA: Diagnosis not present

## 2023-03-22 DIAGNOSIS — D6859 Other primary thrombophilia: Secondary | ICD-10-CM | POA: Diagnosis not present

## 2023-04-05 DIAGNOSIS — E559 Vitamin D deficiency, unspecified: Secondary | ICD-10-CM | POA: Diagnosis not present

## 2023-04-05 DIAGNOSIS — E1042 Type 1 diabetes mellitus with diabetic polyneuropathy: Secondary | ICD-10-CM | POA: Diagnosis not present

## 2023-04-05 DIAGNOSIS — M109 Gout, unspecified: Secondary | ICD-10-CM | POA: Diagnosis not present

## 2023-04-05 DIAGNOSIS — N401 Enlarged prostate with lower urinary tract symptoms: Secondary | ICD-10-CM | POA: Diagnosis not present

## 2023-04-05 DIAGNOSIS — I130 Hypertensive heart and chronic kidney disease with heart failure and stage 1 through stage 4 chronic kidney disease, or unspecified chronic kidney disease: Secondary | ICD-10-CM | POA: Diagnosis not present

## 2023-04-05 DIAGNOSIS — E785 Hyperlipidemia, unspecified: Secondary | ICD-10-CM | POA: Diagnosis not present

## 2023-05-03 DIAGNOSIS — D6859 Other primary thrombophilia: Secondary | ICD-10-CM | POA: Diagnosis not present

## 2023-05-19 DIAGNOSIS — R42 Dizziness and giddiness: Secondary | ICD-10-CM | POA: Diagnosis not present

## 2023-05-19 DIAGNOSIS — I440 Atrioventricular block, first degree: Secondary | ICD-10-CM | POA: Diagnosis not present

## 2023-05-19 DIAGNOSIS — R911 Solitary pulmonary nodule: Secondary | ICD-10-CM | POA: Diagnosis not present

## 2023-05-19 DIAGNOSIS — N289 Disorder of kidney and ureter, unspecified: Secondary | ICD-10-CM | POA: Diagnosis not present

## 2023-05-19 DIAGNOSIS — R918 Other nonspecific abnormal finding of lung field: Secondary | ICD-10-CM | POA: Diagnosis not present

## 2023-05-19 DIAGNOSIS — R0789 Other chest pain: Secondary | ICD-10-CM | POA: Diagnosis not present

## 2023-05-19 DIAGNOSIS — R079 Chest pain, unspecified: Secondary | ICD-10-CM | POA: Diagnosis not present

## 2023-05-19 DIAGNOSIS — R0602 Shortness of breath: Secondary | ICD-10-CM | POA: Diagnosis not present

## 2023-05-19 DIAGNOSIS — Z7901 Long term (current) use of anticoagulants: Secondary | ICD-10-CM | POA: Diagnosis not present

## 2023-05-19 DIAGNOSIS — J9811 Atelectasis: Secondary | ICD-10-CM | POA: Diagnosis not present

## 2023-05-19 DIAGNOSIS — J8410 Pulmonary fibrosis, unspecified: Secondary | ICD-10-CM | POA: Diagnosis not present

## 2023-05-20 DIAGNOSIS — E103553 Type 1 diabetes mellitus with stable proliferative diabetic retinopathy, bilateral: Secondary | ICD-10-CM | POA: Diagnosis not present

## 2023-05-24 DIAGNOSIS — D6859 Other primary thrombophilia: Secondary | ICD-10-CM | POA: Diagnosis not present

## 2023-05-24 DIAGNOSIS — K080 Exfoliation of teeth due to systemic causes: Secondary | ICD-10-CM | POA: Diagnosis not present

## 2023-05-24 DIAGNOSIS — R911 Solitary pulmonary nodule: Secondary | ICD-10-CM | POA: Diagnosis not present

## 2023-06-03 DIAGNOSIS — I129 Hypertensive chronic kidney disease with stage 1 through stage 4 chronic kidney disease, or unspecified chronic kidney disease: Secondary | ICD-10-CM | POA: Diagnosis not present

## 2023-06-03 DIAGNOSIS — Z7901 Long term (current) use of anticoagulants: Secondary | ICD-10-CM | POA: Diagnosis not present

## 2023-06-03 DIAGNOSIS — E1042 Type 1 diabetes mellitus with diabetic polyneuropathy: Secondary | ICD-10-CM | POA: Diagnosis not present

## 2023-07-01 DIAGNOSIS — D6859 Other primary thrombophilia: Secondary | ICD-10-CM | POA: Diagnosis not present

## 2023-07-29 DIAGNOSIS — D6859 Other primary thrombophilia: Secondary | ICD-10-CM | POA: Diagnosis not present

## 2023-07-29 DIAGNOSIS — Z23 Encounter for immunization: Secondary | ICD-10-CM | POA: Diagnosis not present

## 2023-07-29 DIAGNOSIS — E1042 Type 1 diabetes mellitus with diabetic polyneuropathy: Secondary | ICD-10-CM | POA: Diagnosis not present

## 2023-08-03 DIAGNOSIS — R918 Other nonspecific abnormal finding of lung field: Secondary | ICD-10-CM | POA: Diagnosis not present

## 2023-08-10 DIAGNOSIS — I2510 Atherosclerotic heart disease of native coronary artery without angina pectoris: Secondary | ICD-10-CM | POA: Diagnosis not present

## 2023-08-10 DIAGNOSIS — Z952 Presence of prosthetic heart valve: Secondary | ICD-10-CM | POA: Diagnosis not present

## 2023-08-10 DIAGNOSIS — E785 Hyperlipidemia, unspecified: Secondary | ICD-10-CM | POA: Diagnosis not present

## 2023-08-10 DIAGNOSIS — R0602 Shortness of breath: Secondary | ICD-10-CM | POA: Diagnosis not present

## 2023-08-10 DIAGNOSIS — E1042 Type 1 diabetes mellitus with diabetic polyneuropathy: Secondary | ICD-10-CM | POA: Diagnosis not present

## 2023-08-10 DIAGNOSIS — I130 Hypertensive heart and chronic kidney disease with heart failure and stage 1 through stage 4 chronic kidney disease, or unspecified chronic kidney disease: Secondary | ICD-10-CM | POA: Diagnosis not present

## 2023-08-10 DIAGNOSIS — R9431 Abnormal electrocardiogram [ECG] [EKG]: Secondary | ICD-10-CM | POA: Diagnosis not present

## 2023-08-11 DIAGNOSIS — I2510 Atherosclerotic heart disease of native coronary artery without angina pectoris: Secondary | ICD-10-CM | POA: Diagnosis not present

## 2023-08-11 DIAGNOSIS — E785 Hyperlipidemia, unspecified: Secondary | ICD-10-CM | POA: Diagnosis not present

## 2023-08-11 DIAGNOSIS — Z952 Presence of prosthetic heart valve: Secondary | ICD-10-CM | POA: Diagnosis not present

## 2023-08-11 DIAGNOSIS — R9431 Abnormal electrocardiogram [ECG] [EKG]: Secondary | ICD-10-CM | POA: Diagnosis not present

## 2023-08-11 DIAGNOSIS — R0602 Shortness of breath: Secondary | ICD-10-CM | POA: Diagnosis not present

## 2023-08-11 DIAGNOSIS — I10 Essential (primary) hypertension: Secondary | ICD-10-CM | POA: Diagnosis not present

## 2023-08-12 DIAGNOSIS — R931 Abnormal findings on diagnostic imaging of heart and coronary circulation: Secondary | ICD-10-CM | POA: Diagnosis not present

## 2023-08-12 DIAGNOSIS — Z952 Presence of prosthetic heart valve: Secondary | ICD-10-CM | POA: Diagnosis not present

## 2023-08-12 DIAGNOSIS — I2510 Atherosclerotic heart disease of native coronary artery without angina pectoris: Secondary | ICD-10-CM | POA: Diagnosis not present

## 2023-08-12 DIAGNOSIS — R0602 Shortness of breath: Secondary | ICD-10-CM | POA: Diagnosis not present

## 2023-08-20 DIAGNOSIS — I2510 Atherosclerotic heart disease of native coronary artery without angina pectoris: Secondary | ICD-10-CM | POA: Diagnosis not present

## 2023-08-20 DIAGNOSIS — R931 Abnormal findings on diagnostic imaging of heart and coronary circulation: Secondary | ICD-10-CM | POA: Diagnosis not present

## 2023-08-20 DIAGNOSIS — R0602 Shortness of breath: Secondary | ICD-10-CM | POA: Diagnosis not present

## 2023-08-23 DIAGNOSIS — Z886 Allergy status to analgesic agent status: Secondary | ICD-10-CM | POA: Diagnosis not present

## 2023-08-23 DIAGNOSIS — I25112 Atherosclerotic heart disease of native coronary artery with refractory angina pectoris: Secondary | ICD-10-CM | POA: Diagnosis not present

## 2023-08-23 DIAGNOSIS — Z87891 Personal history of nicotine dependence: Secondary | ICD-10-CM | POA: Diagnosis not present

## 2023-08-23 DIAGNOSIS — I110 Hypertensive heart disease with heart failure: Secondary | ICD-10-CM | POA: Diagnosis not present

## 2023-08-23 DIAGNOSIS — E785 Hyperlipidemia, unspecified: Secondary | ICD-10-CM | POA: Diagnosis not present

## 2023-08-23 DIAGNOSIS — Z7982 Long term (current) use of aspirin: Secondary | ICD-10-CM | POA: Diagnosis not present

## 2023-08-23 DIAGNOSIS — E669 Obesity, unspecified: Secondary | ICD-10-CM | POA: Diagnosis not present

## 2023-08-23 DIAGNOSIS — Z794 Long term (current) use of insulin: Secondary | ICD-10-CM | POA: Diagnosis not present

## 2023-08-23 DIAGNOSIS — Z6835 Body mass index (BMI) 35.0-35.9, adult: Secondary | ICD-10-CM | POA: Diagnosis not present

## 2023-08-23 DIAGNOSIS — I509 Heart failure, unspecified: Secondary | ICD-10-CM | POA: Diagnosis not present

## 2023-08-23 DIAGNOSIS — Z79899 Other long term (current) drug therapy: Secondary | ICD-10-CM | POA: Diagnosis not present

## 2023-08-23 DIAGNOSIS — Z952 Presence of prosthetic heart valve: Secondary | ICD-10-CM | POA: Diagnosis not present

## 2023-08-23 DIAGNOSIS — E119 Type 2 diabetes mellitus without complications: Secondary | ICD-10-CM | POA: Diagnosis not present

## 2023-08-23 DIAGNOSIS — R9439 Abnormal result of other cardiovascular function study: Secondary | ICD-10-CM | POA: Diagnosis not present

## 2023-08-23 DIAGNOSIS — Z7901 Long term (current) use of anticoagulants: Secondary | ICD-10-CM | POA: Diagnosis not present

## 2023-08-23 DIAGNOSIS — Z955 Presence of coronary angioplasty implant and graft: Secondary | ICD-10-CM | POA: Diagnosis not present

## 2023-08-24 DIAGNOSIS — Z886 Allergy status to analgesic agent status: Secondary | ICD-10-CM | POA: Diagnosis not present

## 2023-08-24 DIAGNOSIS — E119 Type 2 diabetes mellitus without complications: Secondary | ICD-10-CM | POA: Diagnosis not present

## 2023-08-24 DIAGNOSIS — Z7982 Long term (current) use of aspirin: Secondary | ICD-10-CM | POA: Diagnosis not present

## 2023-08-24 DIAGNOSIS — I25112 Atherosclerotic heart disease of native coronary artery with refractory angina pectoris: Secondary | ICD-10-CM | POA: Diagnosis not present

## 2023-08-24 DIAGNOSIS — E669 Obesity, unspecified: Secondary | ICD-10-CM | POA: Diagnosis not present

## 2023-08-24 DIAGNOSIS — Z955 Presence of coronary angioplasty implant and graft: Secondary | ICD-10-CM | POA: Diagnosis not present

## 2023-08-24 DIAGNOSIS — Z79899 Other long term (current) drug therapy: Secondary | ICD-10-CM | POA: Diagnosis not present

## 2023-08-24 DIAGNOSIS — Z6835 Body mass index (BMI) 35.0-35.9, adult: Secondary | ICD-10-CM | POA: Diagnosis not present

## 2023-08-24 DIAGNOSIS — I509 Heart failure, unspecified: Secondary | ICD-10-CM | POA: Diagnosis not present

## 2023-08-24 DIAGNOSIS — Z87891 Personal history of nicotine dependence: Secondary | ICD-10-CM | POA: Diagnosis not present

## 2023-08-24 DIAGNOSIS — Z952 Presence of prosthetic heart valve: Secondary | ICD-10-CM | POA: Diagnosis not present

## 2023-08-24 DIAGNOSIS — Z7901 Long term (current) use of anticoagulants: Secondary | ICD-10-CM | POA: Diagnosis not present

## 2023-08-24 DIAGNOSIS — R9439 Abnormal result of other cardiovascular function study: Secondary | ICD-10-CM | POA: Diagnosis not present

## 2023-08-24 DIAGNOSIS — E785 Hyperlipidemia, unspecified: Secondary | ICD-10-CM | POA: Diagnosis not present

## 2023-08-24 DIAGNOSIS — I110 Hypertensive heart disease with heart failure: Secondary | ICD-10-CM | POA: Diagnosis not present

## 2023-08-24 DIAGNOSIS — Z794 Long term (current) use of insulin: Secondary | ICD-10-CM | POA: Diagnosis not present

## 2023-08-27 DIAGNOSIS — E1042 Type 1 diabetes mellitus with diabetic polyneuropathy: Secondary | ICD-10-CM | POA: Diagnosis not present

## 2023-08-27 DIAGNOSIS — Z7901 Long term (current) use of anticoagulants: Secondary | ICD-10-CM | POA: Diagnosis not present

## 2023-09-10 DIAGNOSIS — Z7901 Long term (current) use of anticoagulants: Secondary | ICD-10-CM | POA: Diagnosis not present

## 2023-10-14 DIAGNOSIS — D6859 Other primary thrombophilia: Secondary | ICD-10-CM | POA: Diagnosis not present

## 2023-10-14 DIAGNOSIS — N529 Male erectile dysfunction, unspecified: Secondary | ICD-10-CM | POA: Diagnosis not present

## 2023-10-14 DIAGNOSIS — Z952 Presence of prosthetic heart valve: Secondary | ICD-10-CM | POA: Diagnosis not present

## 2023-11-11 DIAGNOSIS — E1042 Type 1 diabetes mellitus with diabetic polyneuropathy: Secondary | ICD-10-CM | POA: Diagnosis not present

## 2023-11-11 DIAGNOSIS — I10 Essential (primary) hypertension: Secondary | ICD-10-CM | POA: Diagnosis not present

## 2023-11-11 DIAGNOSIS — Z952 Presence of prosthetic heart valve: Secondary | ICD-10-CM | POA: Diagnosis not present

## 2023-11-11 DIAGNOSIS — R0602 Shortness of breath: Secondary | ICD-10-CM | POA: Diagnosis not present

## 2023-11-11 DIAGNOSIS — D6859 Other primary thrombophilia: Secondary | ICD-10-CM | POA: Diagnosis not present

## 2023-11-11 DIAGNOSIS — I2510 Atherosclerotic heart disease of native coronary artery without angina pectoris: Secondary | ICD-10-CM | POA: Diagnosis not present

## 2023-11-12 DIAGNOSIS — N5201 Erectile dysfunction due to arterial insufficiency: Secondary | ICD-10-CM | POA: Diagnosis not present

## 2023-12-01 DIAGNOSIS — R9389 Abnormal findings on diagnostic imaging of other specified body structures: Secondary | ICD-10-CM | POA: Diagnosis not present

## 2023-12-01 DIAGNOSIS — R590 Localized enlarged lymph nodes: Secondary | ICD-10-CM | POA: Diagnosis not present

## 2023-12-01 DIAGNOSIS — Z8709 Personal history of other diseases of the respiratory system: Secondary | ICD-10-CM | POA: Diagnosis not present

## 2023-12-01 DIAGNOSIS — R0602 Shortness of breath: Secondary | ICD-10-CM | POA: Diagnosis not present

## 2023-12-01 DIAGNOSIS — R058 Other specified cough: Secondary | ICD-10-CM | POA: Diagnosis not present

## 2023-12-01 DIAGNOSIS — R935 Abnormal findings on diagnostic imaging of other abdominal regions, including retroperitoneum: Secondary | ICD-10-CM | POA: Diagnosis not present

## 2023-12-01 DIAGNOSIS — K219 Gastro-esophageal reflux disease without esophagitis: Secondary | ICD-10-CM | POA: Diagnosis not present

## 2023-12-01 DIAGNOSIS — R051 Acute cough: Secondary | ICD-10-CM | POA: Diagnosis not present

## 2023-12-01 DIAGNOSIS — Z20822 Contact with and (suspected) exposure to covid-19: Secondary | ICD-10-CM | POA: Diagnosis not present

## 2023-12-01 DIAGNOSIS — D649 Anemia, unspecified: Secondary | ICD-10-CM | POA: Diagnosis not present

## 2023-12-01 DIAGNOSIS — R55 Syncope and collapse: Secondary | ICD-10-CM | POA: Diagnosis not present

## 2023-12-01 DIAGNOSIS — E108 Type 1 diabetes mellitus with unspecified complications: Secondary | ICD-10-CM | POA: Diagnosis not present

## 2023-12-01 DIAGNOSIS — I517 Cardiomegaly: Secondary | ICD-10-CM | POA: Diagnosis not present

## 2023-12-01 DIAGNOSIS — R0902 Hypoxemia: Secondary | ICD-10-CM | POA: Diagnosis not present

## 2023-12-01 DIAGNOSIS — R918 Other nonspecific abnormal finding of lung field: Secondary | ICD-10-CM | POA: Diagnosis not present

## 2023-12-01 DIAGNOSIS — Z955 Presence of coronary angioplasty implant and graft: Secondary | ICD-10-CM | POA: Diagnosis not present

## 2023-12-01 DIAGNOSIS — Z87891 Personal history of nicotine dependence: Secondary | ICD-10-CM | POA: Diagnosis not present

## 2023-12-01 DIAGNOSIS — K2289 Other specified disease of esophagus: Secondary | ICD-10-CM | POA: Diagnosis not present

## 2023-12-02 DIAGNOSIS — R0602 Shortness of breath: Secondary | ICD-10-CM | POA: Diagnosis not present

## 2023-12-11 DIAGNOSIS — I10 Essential (primary) hypertension: Secondary | ICD-10-CM | POA: Diagnosis not present

## 2023-12-11 DIAGNOSIS — E162 Hypoglycemia, unspecified: Secondary | ICD-10-CM | POA: Diagnosis not present

## 2023-12-11 DIAGNOSIS — Z87891 Personal history of nicotine dependence: Secondary | ICD-10-CM | POA: Diagnosis not present

## 2023-12-11 DIAGNOSIS — Z955 Presence of coronary angioplasty implant and graft: Secondary | ICD-10-CM | POA: Diagnosis not present

## 2023-12-11 DIAGNOSIS — F419 Anxiety disorder, unspecified: Secondary | ICD-10-CM | POA: Diagnosis not present

## 2023-12-11 DIAGNOSIS — E11649 Type 2 diabetes mellitus with hypoglycemia without coma: Secondary | ICD-10-CM | POA: Diagnosis not present

## 2023-12-11 DIAGNOSIS — R9431 Abnormal electrocardiogram [ECG] [EKG]: Secondary | ICD-10-CM | POA: Diagnosis not present

## 2023-12-11 DIAGNOSIS — Z794 Long term (current) use of insulin: Secondary | ICD-10-CM | POA: Diagnosis not present

## 2023-12-11 DIAGNOSIS — E160 Drug-induced hypoglycemia without coma: Secondary | ICD-10-CM | POA: Diagnosis not present

## 2023-12-11 DIAGNOSIS — E161 Other hypoglycemia: Secondary | ICD-10-CM | POA: Diagnosis not present

## 2023-12-11 DIAGNOSIS — I2510 Atherosclerotic heart disease of native coronary artery without angina pectoris: Secondary | ICD-10-CM | POA: Diagnosis not present

## 2023-12-11 DIAGNOSIS — Z7901 Long term (current) use of anticoagulants: Secondary | ICD-10-CM | POA: Diagnosis not present

## 2023-12-11 DIAGNOSIS — R918 Other nonspecific abnormal finding of lung field: Secondary | ICD-10-CM | POA: Diagnosis not present

## 2023-12-11 DIAGNOSIS — Z952 Presence of prosthetic heart valve: Secondary | ICD-10-CM | POA: Diagnosis not present

## 2023-12-11 DIAGNOSIS — R0689 Other abnormalities of breathing: Secondary | ICD-10-CM | POA: Diagnosis not present

## 2023-12-11 DIAGNOSIS — F32A Depression, unspecified: Secondary | ICD-10-CM | POA: Diagnosis not present

## 2023-12-11 DIAGNOSIS — R531 Weakness: Secondary | ICD-10-CM | POA: Diagnosis not present

## 2023-12-11 DIAGNOSIS — Z7982 Long term (current) use of aspirin: Secondary | ICD-10-CM | POA: Diagnosis not present

## 2023-12-11 DIAGNOSIS — I119 Hypertensive heart disease without heart failure: Secondary | ICD-10-CM | POA: Diagnosis not present

## 2023-12-14 DIAGNOSIS — Z7901 Long term (current) use of anticoagulants: Secondary | ICD-10-CM | POA: Diagnosis not present

## 2023-12-14 DIAGNOSIS — I130 Hypertensive heart and chronic kidney disease with heart failure and stage 1 through stage 4 chronic kidney disease, or unspecified chronic kidney disease: Secondary | ICD-10-CM | POA: Diagnosis not present

## 2023-12-14 DIAGNOSIS — E1042 Type 1 diabetes mellitus with diabetic polyneuropathy: Secondary | ICD-10-CM | POA: Diagnosis not present

## 2023-12-16 DIAGNOSIS — Z952 Presence of prosthetic heart valve: Secondary | ICD-10-CM | POA: Diagnosis not present

## 2023-12-16 DIAGNOSIS — K080 Exfoliation of teeth due to systemic causes: Secondary | ICD-10-CM | POA: Diagnosis not present

## 2023-12-16 DIAGNOSIS — R55 Syncope and collapse: Secondary | ICD-10-CM | POA: Diagnosis not present

## 2023-12-16 DIAGNOSIS — I10 Essential (primary) hypertension: Secondary | ICD-10-CM | POA: Diagnosis not present

## 2023-12-16 DIAGNOSIS — I2510 Atherosclerotic heart disease of native coronary artery without angina pectoris: Secondary | ICD-10-CM | POA: Diagnosis not present

## 2023-12-16 DIAGNOSIS — Z1330 Encounter for screening examination for mental health and behavioral disorders, unspecified: Secondary | ICD-10-CM | POA: Diagnosis not present

## 2023-12-21 DIAGNOSIS — R55 Syncope and collapse: Secondary | ICD-10-CM | POA: Diagnosis not present

## 2023-12-30 DIAGNOSIS — K080 Exfoliation of teeth due to systemic causes: Secondary | ICD-10-CM | POA: Diagnosis not present

## 2024-01-11 DIAGNOSIS — D6859 Other primary thrombophilia: Secondary | ICD-10-CM | POA: Diagnosis not present

## 2024-01-11 DIAGNOSIS — Z952 Presence of prosthetic heart valve: Secondary | ICD-10-CM | POA: Diagnosis not present

## 2024-01-26 DIAGNOSIS — R55 Syncope and collapse: Secondary | ICD-10-CM | POA: Diagnosis not present

## 2024-02-17 DIAGNOSIS — D6859 Other primary thrombophilia: Secondary | ICD-10-CM | POA: Diagnosis not present

## 2024-03-15 DIAGNOSIS — I10 Essential (primary) hypertension: Secondary | ICD-10-CM | POA: Diagnosis not present

## 2024-03-15 DIAGNOSIS — R55 Syncope and collapse: Secondary | ICD-10-CM | POA: Diagnosis not present

## 2024-03-16 DIAGNOSIS — D6859 Other primary thrombophilia: Secondary | ICD-10-CM | POA: Diagnosis not present

## 2024-04-24 DIAGNOSIS — E559 Vitamin D deficiency, unspecified: Secondary | ICD-10-CM | POA: Diagnosis not present

## 2024-04-24 DIAGNOSIS — Z7901 Long term (current) use of anticoagulants: Secondary | ICD-10-CM | POA: Diagnosis not present

## 2024-04-24 DIAGNOSIS — N401 Enlarged prostate with lower urinary tract symptoms: Secondary | ICD-10-CM | POA: Diagnosis not present

## 2024-04-24 DIAGNOSIS — E1042 Type 1 diabetes mellitus with diabetic polyneuropathy: Secondary | ICD-10-CM | POA: Diagnosis not present

## 2024-04-24 DIAGNOSIS — I130 Hypertensive heart and chronic kidney disease with heart failure and stage 1 through stage 4 chronic kidney disease, or unspecified chronic kidney disease: Secondary | ICD-10-CM | POA: Diagnosis not present

## 2024-04-24 DIAGNOSIS — R82998 Other abnormal findings in urine: Secondary | ICD-10-CM | POA: Diagnosis not present

## 2024-04-24 DIAGNOSIS — M109 Gout, unspecified: Secondary | ICD-10-CM | POA: Diagnosis not present

## 2024-04-24 DIAGNOSIS — E785 Hyperlipidemia, unspecified: Secondary | ICD-10-CM | POA: Diagnosis not present

## 2024-04-24 DIAGNOSIS — Z0000 Encounter for general adult medical examination without abnormal findings: Secondary | ICD-10-CM | POA: Diagnosis not present

## 2024-05-19 DIAGNOSIS — Z952 Presence of prosthetic heart valve: Secondary | ICD-10-CM | POA: Diagnosis not present

## 2024-05-19 DIAGNOSIS — I10 Essential (primary) hypertension: Secondary | ICD-10-CM | POA: Diagnosis not present

## 2024-05-19 DIAGNOSIS — I4729 Other ventricular tachycardia: Secondary | ICD-10-CM | POA: Diagnosis not present

## 2024-05-19 DIAGNOSIS — I2510 Atherosclerotic heart disease of native coronary artery without angina pectoris: Secondary | ICD-10-CM | POA: Diagnosis not present

## 2024-05-19 DIAGNOSIS — R0602 Shortness of breath: Secondary | ICD-10-CM | POA: Diagnosis not present

## 2024-05-25 DIAGNOSIS — D6859 Other primary thrombophilia: Secondary | ICD-10-CM | POA: Diagnosis not present

## 2024-05-30 DIAGNOSIS — Z952 Presence of prosthetic heart valve: Secondary | ICD-10-CM | POA: Diagnosis not present

## 2024-05-30 DIAGNOSIS — I4729 Other ventricular tachycardia: Secondary | ICD-10-CM | POA: Diagnosis not present

## 2024-05-30 DIAGNOSIS — I2510 Atherosclerotic heart disease of native coronary artery without angina pectoris: Secondary | ICD-10-CM | POA: Diagnosis not present

## 2024-05-30 DIAGNOSIS — R0602 Shortness of breath: Secondary | ICD-10-CM | POA: Diagnosis not present

## 2024-06-15 DIAGNOSIS — N5201 Erectile dysfunction due to arterial insufficiency: Secondary | ICD-10-CM | POA: Diagnosis not present

## 2024-06-15 DIAGNOSIS — R399 Unspecified symptoms and signs involving the genitourinary system: Secondary | ICD-10-CM | POA: Diagnosis not present

## 2024-06-15 DIAGNOSIS — R361 Hematospermia: Secondary | ICD-10-CM | POA: Diagnosis not present

## 2024-06-29 DIAGNOSIS — Z23 Encounter for immunization: Secondary | ICD-10-CM | POA: Diagnosis not present

## 2024-06-29 DIAGNOSIS — J8410 Pulmonary fibrosis, unspecified: Secondary | ICD-10-CM | POA: Diagnosis not present

## 2024-06-29 DIAGNOSIS — Z952 Presence of prosthetic heart valve: Secondary | ICD-10-CM | POA: Diagnosis not present

## 2024-07-10 DIAGNOSIS — J8410 Pulmonary fibrosis, unspecified: Secondary | ICD-10-CM | POA: Diagnosis not present

## 2024-07-10 DIAGNOSIS — S99921A Unspecified injury of right foot, initial encounter: Secondary | ICD-10-CM | POA: Diagnosis not present

## 2024-07-27 DIAGNOSIS — Z952 Presence of prosthetic heart valve: Secondary | ICD-10-CM | POA: Diagnosis not present

## 2024-07-27 DIAGNOSIS — D6859 Other primary thrombophilia: Secondary | ICD-10-CM | POA: Diagnosis not present
